# Patient Record
Sex: Female | Born: 1960 | Race: White | Hispanic: No | Marital: Married | Smoking: Never smoker
Health system: Southern US, Community
[De-identification: ages and names within clinical notes are randomized; demographics above are authoritative.]

## PROBLEM LIST (undated history)

## (undated) DIAGNOSIS — E785 Hyperlipidemia, unspecified: Secondary | ICD-10-CM

## (undated) DIAGNOSIS — R7303 Prediabetes: Secondary | ICD-10-CM

## (undated) DIAGNOSIS — Z8603 Personal history of neoplasm of uncertain behavior: Secondary | ICD-10-CM

## (undated) DIAGNOSIS — Z9889 Other specified postprocedural states: Secondary | ICD-10-CM

## (undated) HISTORY — DX: Prediabetes: R73.03

## (undated) HISTORY — DX: Hyperlipidemia, unspecified: E78.5

## (undated) HISTORY — DX: Personal history of neoplasm of uncertain behavior: Z86.03

## (undated) HISTORY — DX: Other specified postprocedural states: Z98.890

---

## 2019-04-16 DIAGNOSIS — G9600 Cerebrospinal fluid leak, unspecified: Secondary | ICD-10-CM | POA: Insufficient documentation

## 2019-06-16 DIAGNOSIS — F331 Major depressive disorder, recurrent, moderate: Secondary | ICD-10-CM | POA: Insufficient documentation

## 2020-05-27 ENCOUNTER — Other Ambulatory Visit: Payer: Self-pay | Admitting: Internal Medicine

## 2020-05-27 DIAGNOSIS — Z1231 Encounter for screening mammogram for malignant neoplasm of breast: Secondary | ICD-10-CM
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
## 2020-11-07 ENCOUNTER — Other Ambulatory Visit: Payer: Self-pay

## 2020-11-07 ENCOUNTER — Telehealth (INDEPENDENT_AMBULATORY_CARE_PROVIDER_SITE_OTHER): Payer: 59 | Admitting: Psychiatry

## 2020-11-07 ENCOUNTER — Encounter: Payer: Self-pay | Admitting: Psychiatry

## 2020-11-07 DIAGNOSIS — F331 Major depressive disorder, recurrent, moderate: Secondary | ICD-10-CM | POA: Diagnosis not present

## 2020-11-07 MED ORDER — BUPROPION HCL ER (XL) 150 MG PO TB24: 450.0000 mg | ORAL_TABLET | Freq: Every day | ORAL | 1 refills | Status: DC

## 2020-11-07 MED ORDER — TRAZODONE HCL 100 MG PO TABS: 100.0000 mg | ORAL_TABLET | Freq: Every day | ORAL | 1 refills | Status: DC

## 2020-11-07 NOTE — Patient Instructions (Addendum)
1. Increase bupropion 450 mg daily 2. Continue lexapro 20 mg daily,  3. Increase Trazodone 100 mg at night  4  Hold lorazepam  5. Referral to therapy  6. Next appointment: 5/9 at 3 PM   CONTACT INFORMATION  What to do if you need to get in touch with someone regarding a psychiatric issue:  1. EMERGENCY: For psychiatric emergencies (if you are suicidal or if there are any other safety issues) call 911 and/or go to your nearest Emergency Room immediately.   2. IF YOU NEED SOMEONE TO TALK TO RIGHT NOW: Given my clinical responsibilities, I may not be able to speak with you over the phone for a prolonged period of time.  A. You may always call The National Suicide Prevention Lifeline at 1-800-273-TALK 416-566-9163).  B. You may walk in to [facility]  Address: [address] [place], [place] [address], Phone: [phone].  Open 24/7, No appointment required.

## 2020-11-18 ENCOUNTER — Ambulatory Visit (INDEPENDENT_AMBULATORY_CARE_PROVIDER_SITE_OTHER): Payer: 59 | Admitting: Licensed Clinical Social Worker

## 2020-11-18 ENCOUNTER — Other Ambulatory Visit: Payer: Self-pay

## 2020-11-18 DIAGNOSIS — F331 Major depressive disorder, recurrent, moderate: Secondary | ICD-10-CM

## 2020-11-18 NOTE — Progress Notes (Signed)
Virtual Visit via Video Note  I connected with Jill Pittman on 11/18/20 at  9:00 AM EDT by a video enabled telemedicine application and verified that I am speaking with the correct person using two identifiers.  Location: Patient: home  Provider: remote office [place], [place])   I discussed the limitations of evaluation and management by telemedicine and the availability of in person appointments. The patient expressed understanding and agreed to proceed.   I discussed the assessment and treatment plan with the patient. The patient was provided an opportunity to ask questions and all were answered. The patient agreed with the plan and demonstrated an understanding of the instructions.   The patient was advised to call back or seek an in-person evaluation if the symptoms worsen or if the condition fails to improve as anticipated.  I provided 60 minutes of non-face-to-face time during this encounter.   Jill Blake R Marcianne Ozbun, LCSW    THERAPIST PROGRESS NOTE  Session Time: 9-10a  Participation Level: Active  Behavioral Response: Neat and Well GroomedAlertDepressed  Type of Therapy: Individual Therapy  Treatment Goals addressed: Coping  Interventions: CBT  Summary: Jill Pittman is a 60 y.o. female who presents with symptoms consistent with MDD. Pt reports that overall mood has been stable with minor reactions to situational stressors.   Allowed pt to explore past history of loss--pt lost three family members in 6 months time: mother, father, sister. Pt feels that she did not get opportunity to appropriately grieve each loss. Explored pts thoughts and feelings during this time and reviewed interventions to help manage anxiety/racing thoughts (meditation/mindfulness). Encouraged mindfulness to help bring down overall high levels of anxiety. Discussed excoriation behaviors and interventions to assist managing this (lotion/nail hygiene).   Due to extensive trauma-focus at initial  visit, CCA will be completed at next session.  Continued recommendations are as follows: self care behaviors, positive social engagements, focusing on overall work/home/life balance, and focusing on positive physical and emotional wellness.   Suicidal/Homicidal: No  Therapist Response: Clinical assessment and development of treatment plan  Plan: Return again in 3 weeks. Finish CCA  Diagnosis: Axis I: MDD, recurrent, moderate    Axis II: No diagnosis    Jill Haber Nitasha Jewel, LCSW 11/18/2020
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
## 2020-12-12 ENCOUNTER — Other Ambulatory Visit: Payer: Self-pay

## 2020-12-12 ENCOUNTER — Telehealth (INDEPENDENT_AMBULATORY_CARE_PROVIDER_SITE_OTHER): Payer: 59 | Admitting: Psychiatry

## 2020-12-12 ENCOUNTER — Encounter: Payer: Self-pay | Admitting: Psychiatry

## 2020-12-12 DIAGNOSIS — F331 Major depressive disorder, recurrent, moderate: Secondary | ICD-10-CM

## 2020-12-12 MED ORDER — VENLAFAXINE HCL ER 37.5 MG PO CP24: 37.5000 mg | ORAL_CAPSULE | Freq: Every day | ORAL | 0 refills | Status: DC

## 2020-12-12 MED ORDER — TRAZODONE HCL 100 MG PO TABS: 100.0000 mg | ORAL_TABLET | Freq: Every day | ORAL | 1 refills | Status: DC

## 2020-12-12 MED ORDER — VENLAFAXINE HCL ER 150 MG PO CP24: ORAL_CAPSULE | ORAL | 0 refills | Status: DC

## 2020-12-12 NOTE — Patient Instructions (Signed)
1. Decrease bupropion 300 mg daily  2. Decrease lexapro 10 mg daily for one week, then discontinue 3. Start venlafaxine 37.5 mg daily for one week, then 75 mg daily  4. Continue Trazodone 100 mg at night  5. Discontinue lorazepam 5. Next appointment: 5/9 at 3 PM

## 2020-12-26 ENCOUNTER — Other Ambulatory Visit: Payer: Self-pay

## 2020-12-26 ENCOUNTER — Ambulatory Visit (INDEPENDENT_AMBULATORY_CARE_PROVIDER_SITE_OTHER): Payer: 59 | Admitting: Licensed Clinical Social Worker

## 2020-12-26 DIAGNOSIS — F331 Major depressive disorder, recurrent, moderate: Secondary | ICD-10-CM | POA: Diagnosis not present

## 2020-12-26 NOTE — Progress Notes (Signed)
Virtual Visit via Video Note  I connected with Jill Pittman on 12/26/20 at  4:00 PM EDT by a video enabled telemedicine application and verified that I am speaking with the correct person using two identifiers.  Location: Patient: home Provider: remote office [place], [place])   I discussed the limitations of evaluation and management by telemedicine and the availability of in person appointments. The patient expressed understanding and agreed to proceed.  I discussed the assessment and treatment plan with the patient. The patient was provided an opportunity to ask questions and all were answered. The patient agreed with the plan and demonstrated an understanding of the instructions.   The patient was advised to call back or seek an in-person evaluation if the symptoms worsen or if the condition fails to improve as anticipated.  I provided 40 minutes of non-face-to-face time during this encounter.   Nikki Rusnak R Dezirae Service, LCSW    THERAPIST PROGRESS NOTE  Session Time: 4-4:40p  Participation Level: Active  Behavioral Response: Neat and Well GroomedAlertAnxious and Depressed  Type of Therapy: Individual Therapy  Treatment Goals addressed: Anxiety and Coping  Interventions: CBT  Summary: Jill Pittman is a 60 y.o. female who presents with symptoms consistent with depression. Pt reports that currently overall mood is stable. Pt reports that she is able to manage stress and anxiety well. Pt reports that she is compliant with medication--reiterated importance of overall medication compliance.   Allowed pt safe space to explore and express thoughts and feelings about recent life events and external stressors. Pt reports that she has had a few challenges recently:  Both cars have needed significant and expensive repairs, pt is feeling hopeless over the "state of the world", and feeling that the world is divided. Encouraged pt to set limits to watching news/current events.   Pt states  that they are currently looking for a home and are fearful that the housing crisis will do the same in [place] as it has in [place].  "that's why we moved here".   Discussed overall bedtime routine and pt states that she is taking Trazodone nightly.  "its hard for me to fall asleep but once im asleep im ok".  Reviewed some strategies that are helpful for sleep hygiene.    Pt and husband planned trip to [place] and pt is excited about it. Encouraged pt to continue w/ recreational activities and overall social engagement as much as possible.    Continued recommendations are as follows: self care behaviors, positive social engagements, focusing on overall work/home/life balance, and focusing on positive physical and emotional wellness.   Suicidal/Homicidal: No  Therapist Response: [name] is verbalizing an understanding of how thoughts, physical feelings, and behavioral actions contribute to anxiety and its treatment. [name] is continuing to utilize behavioral strategies to overcome depression. These behaviors are reflective of both personal growth and progress. Treatment to continue as indicated.  Plan: Return again in 4 weeks.  Diagnosis: Axis I: MDD, recurrent, moderate    Axis II: No diagnosis    Ernest Haber Jonny Longino, LCSW 12/26/2020
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
## 2021-01-16 ENCOUNTER — Encounter: Payer: Self-pay | Admitting: Psychiatry

## 2021-01-16 ENCOUNTER — Other Ambulatory Visit: Payer: Self-pay

## 2021-01-16 ENCOUNTER — Telehealth (INDEPENDENT_AMBULATORY_CARE_PROVIDER_SITE_OTHER): Payer: 59 | Admitting: Psychiatry

## 2021-01-16 DIAGNOSIS — G4700 Insomnia, unspecified: Secondary | ICD-10-CM | POA: Diagnosis not present

## 2021-01-16 DIAGNOSIS — F330 Major depressive disorder, recurrent, mild: Secondary | ICD-10-CM | POA: Diagnosis not present

## 2021-01-16 NOTE — Patient Instructions (Signed)
Continue  bupropion 300 mg daily  2. Continue venlafaxine 150 mg daily   3. Continue Trazodone 100 mg at night  4. Next appointment: 7/19 at 4 PM

## 2021-01-23 ENCOUNTER — Telehealth: Payer: Self-pay

## 2021-01-23 ENCOUNTER — Other Ambulatory Visit: Payer: Self-pay

## 2021-01-23 ENCOUNTER — Ambulatory Visit (INDEPENDENT_AMBULATORY_CARE_PROVIDER_SITE_OTHER): Payer: 59 | Admitting: Licensed Clinical Social Worker

## 2021-01-23 ENCOUNTER — Other Ambulatory Visit: Payer: Self-pay | Admitting: Psychiatry

## 2021-01-23 DIAGNOSIS — Z5329 Procedure and treatment not carried out because of patient's decision for other reasons: Secondary | ICD-10-CM

## 2021-01-23 MED ORDER — VENLAFAXINE HCL ER 150 MG PO CP24: 150.0000 mg | ORAL_CAPSULE | Freq: Every day | ORAL | 0 refills | Status: DC

## 2021-01-23 NOTE — Telephone Encounter (Signed)
received a fax requesting a refill on the venlafaxine er 150mg 

## 2021-01-23 NOTE — Progress Notes (Signed)
LCSW counselor tried to connect with patient for scheduled appointment via MyChart video text request x 2 and email request; also tried to connect via phone without success. LCSW counselor left message for patient to call office number to reschedule OPT appointment.  Sasuke Yaffe, MSW, LCSW Outpatient Therapist/Triage Specialist  

## 2021-01-23 NOTE — Telephone Encounter (Signed)
Ordered
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
## 2021-02-21 ENCOUNTER — Encounter: Payer: Self-pay | Admitting: Psychiatry

## 2021-02-21 ENCOUNTER — Other Ambulatory Visit: Payer: Self-pay

## 2021-02-21 ENCOUNTER — Telehealth (INDEPENDENT_AMBULATORY_CARE_PROVIDER_SITE_OTHER): Payer: 59 | Admitting: Psychiatry

## 2021-02-21 DIAGNOSIS — G4700 Insomnia, unspecified: Secondary | ICD-10-CM | POA: Diagnosis not present

## 2021-02-21 DIAGNOSIS — F330 Major depressive disorder, recurrent, mild: Secondary | ICD-10-CM

## 2021-02-21 MED ORDER — BUPROPION HCL ER (XL) 150 MG PO TB24: 450.0000 mg | ORAL_TABLET | Freq: Every day | ORAL | 0 refills | Status: DC

## 2021-02-21 MED ORDER — VENLAFAXINE HCL ER 37.5 MG PO CP24: ORAL_CAPSULE | ORAL | 0 refills | Status: DC

## 2021-02-21 MED ORDER — HYDROXYZINE HCL 25 MG PO TABS: 25.0000 mg | ORAL_TABLET | Freq: Every day | ORAL | 0 refills | Status: DC | PRN

## 2021-02-21 MED ORDER — TRAZODONE HCL 100 MG PO TABS: 100.0000 mg | ORAL_TABLET | Freq: Every day | ORAL | 0 refills | Status: DC

## 2021-02-21 MED ORDER — VENLAFAXINE HCL ER 150 MG PO CP24: ORAL_CAPSULE | ORAL | 0 refills | Status: DC

## 2021-02-21 NOTE — Patient Instructions (Addendum)
Continue  bupropion 300 mg daily  2. Increase  venlafaxine 187.5 mg daily   3. Continue Trazodone 100 mg at night  4. Start hydroxyzine 25 mg daily as needed for anxiety 4. Next appointment: 10/10 at 1:40

## 2021-03-10 ENCOUNTER — Other Ambulatory Visit: Payer: Self-pay

## 2021-03-10 ENCOUNTER — Ambulatory Visit (INDEPENDENT_AMBULATORY_CARE_PROVIDER_SITE_OTHER): Payer: 59 | Admitting: Licensed Clinical Social Worker

## 2021-03-10 ENCOUNTER — Encounter: Payer: Self-pay | Admitting: Licensed Clinical Social Worker

## 2021-03-10 DIAGNOSIS — F330 Major depressive disorder, recurrent, mild: Secondary | ICD-10-CM | POA: Diagnosis not present

## 2021-03-10 NOTE — Progress Notes (Signed)
Virtual Visit via Video Note  I connected with [name] on 03/10/21 at 10:00 AM EDT by a video enabled telemedicine application and verified that I am speaking with the correct person using two identifiers.  Location: Patient: home Provider: remote office [place], [place])   I discussed the limitations of evaluation and management by telemedicine and the availability of in person appointments. The patient expressed understanding and agreed to proceed.   I discussed the assessment and treatment plan with the patient. The patient was provided an opportunity to ask questions and all were answered. The patient agreed with the plan and demonstrated an understanding of the instructions.   The patient was advised to call back or seek an in-person evaluation if the symptoms worsen or if the condition fails to improve as anticipated.  I provided 40 minutes of non-face-to-face time during this encounter.   Jill Pittman R Jill Blackard, LCSW   THERAPIST PROGRESS NOTE  Session Time: 10-10:40a  Participation Level: Active  Behavioral Response: Neat and Well GroomedAlertAnxious  Type of Therapy: Individual Therapy  Treatment Goals addressed: Anxiety and Coping  Interventions: Solution Focused and Supportive  Summary: Jill Pittman is a 60 y.o. female who presents with improving symptoms related to depression diagnosis. patient reports that overall mood is stable. Patient reports that she is managing stress and anxiety symptoms well. Allowed patient safe space to explore and express thoughts and feelings associated with recent external stressors. Patient reports that she had a colleague that she had a personality conflict with a couple of weeks ago. Patient reports that she did struggle with this for quite some time, and then had a direct conversation with her supervisor. Patient is no longer working with this individual, and is feeling less stress about work currently. Discussed benefits of  assertive communication skills. Patient reports that her and husband recently placed an offer on a house that was rejected. Allowed patient safe space to explore thoughts and feelings associated with this, and plans moving forward. Discussed husbands current dislike of job, and how a history of DUI several years ago has been a barrier to future employment. Patient reports that she feels her husband is depressed, and is trying to be a supportive spouse. Patient says that she's trying to get him counseling, and other psychiatric services. Reviewed current coping mechanisms, and explored new coping mechanisms that can help patient manage anxiety and depression symptoms. Patient reports that she likes to have trips planned, and is planning to go visit son in [place] soon.Continued recommendations are as follows: self care behaviors, positive social engagements, focusing on overall work/home/life balance, and focusing on positive physical and emotional wellness.  .   Suicidal/Homicidal: No  Therapist Response: Jill Pittman is verbalizing an understanding of how thoughts, physical feelings, and behavioral actions contribute to anxiety and its treatment. Jill Pittman is continuing to utilize behavioral strategies to overcome depression. These behaviors are reflective of both personal growth and progress. Treatment to continue as indicated.  Plan: Return again in 4 weeks.  Diagnosis: Axis I: MDD, recurrent    Axis II: No diagnosis    Jill Pittman Jill Renninger, LCSW 03/10/2021

## 2021-04-17 ENCOUNTER — Ambulatory Visit (INDEPENDENT_AMBULATORY_CARE_PROVIDER_SITE_OTHER): Payer: 59 | Admitting: Licensed Clinical Social Worker

## 2021-04-17 ENCOUNTER — Other Ambulatory Visit: Payer: Self-pay

## 2021-04-17 DIAGNOSIS — F330 Major depressive disorder, recurrent, mild: Secondary | ICD-10-CM | POA: Diagnosis not present

## 2021-04-18 NOTE — Progress Notes (Signed)
Virtual Visit via Video Note  I connected with Scarlette Slice on 04/18/21 at  4:00 PM EDT by a video enabled telemedicine application and verified that I am speaking with the correct person using two identifiers.  Location: Patient: home Provider: remote office [place], [place])   I discussed the limitations of evaluation and management by telemedicine and the availability of in person appointments. The patient expressed understanding and agreed to proceed.  I discussed the assessment and treatment plan with the patient. The patient was provided an opportunity to ask questions and all were answered. The patient agreed with the plan and demonstrated an understanding of the instructions.   The patient was advised to call back or seek an in-person evaluation if the symptoms worsen or if the condition fails to improve as anticipated.  I provided of non-face-to-face time during this encounter.   Diyari Cherne R Kiyara Bouffard, LCSW   THERAPIST PROGRESS NOTE  Session Time: 4-4:45p  Participation Level: Active  Behavioral Response: Neat and Well GroomedAlertAnxious and Depressed  Type of Therapy: Individual Therapy  Treatment Goals addressed: Anxiety and Diagnosis: depression  Interventions: CBT and Supportive  Summary: Khylie Larmore is a 60 y.o. female who presents with improving symptoms related to depression diagnosis. Pt reports that overall mood has been stable recently and that she feels she is managing stress and anxiety symptoms. Pt reports that she feels medication is managing symptoms well.  Pt reporting good quality and quantity of sleep.   Allowed pt to explore thoughts and feelings associated with recent external stressors and life events. Discussed pts husband's continuing job search. Pt feels her husband's DUI from years ago is hindering job search since he is seeking driving positions. Discussed pt and husband's goal of getting record expunged.   Suicidal/Homicidal:  No  Therapist Response: [name] is verbalizing an understanding of how thoughts, physical feelings, and behavioral actions contribute to anxiety and its treatment. [name] is continuing to utilize behavioral strategies to overcome depression. These behaviors are reflective of both personal growth and progress. Treatment to continue as indicated.  Plan: Return again in 4 weeks.  Diagnosis: Axis I: MDD, recurrent, mild    Axis II: No diagnosis    Ernest Haber Brennon Otterness, LCSW 04/18/2021

## 2021-05-11 ENCOUNTER — Telehealth: Payer: Self-pay

## 2021-05-11 NOTE — Telephone Encounter (Signed)
Will hold at this time, pending next appointment (she should have enough for the next few weeks)

## 2021-05-11 NOTE — Telephone Encounter (Signed)
pt left message that she needs refills on all her medications wellbutrin, hydroxyzine, trazodone, effexxor both
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
## 2021-05-18 ENCOUNTER — Other Ambulatory Visit: Payer: Self-pay

## 2021-05-18 ENCOUNTER — Telehealth (INDEPENDENT_AMBULATORY_CARE_PROVIDER_SITE_OTHER): Payer: 59 | Admitting: Psychiatry

## 2021-05-18 ENCOUNTER — Encounter: Payer: Self-pay | Admitting: Psychiatry

## 2021-05-18 DIAGNOSIS — F330 Major depressive disorder, recurrent, mild: Secondary | ICD-10-CM

## 2021-05-18 DIAGNOSIS — G4700 Insomnia, unspecified: Secondary | ICD-10-CM | POA: Diagnosis not present

## 2021-05-18 DIAGNOSIS — F411 Generalized anxiety disorder: Secondary | ICD-10-CM

## 2021-05-18 MED ORDER — VENLAFAXINE HCL ER 75 MG PO CP24: 225.0000 mg | ORAL_CAPSULE | Freq: Every day | ORAL | 0 refills | Status: DC

## 2021-05-18 MED ORDER — TRAZODONE HCL 100 MG PO TABS: 100.0000 mg | ORAL_TABLET | Freq: Every day | ORAL | 0 refills | Status: DC

## 2021-05-18 MED ORDER — HYDROXYZINE HCL 25 MG PO TABS: 25.0000 mg | ORAL_TABLET | Freq: Every day | ORAL | 0 refills | Status: AC | PRN

## 2021-05-18 MED ORDER — BUPROPION HCL ER (XL) 300 MG PO TB24: 300.0000 mg | ORAL_TABLET | Freq: Every day | ORAL | 0 refills | Status: DC

## 2021-05-18 NOTE — Patient Instructions (Signed)
Continue  bupropion 300 mg daily  Increase venlafaxine to 225 mg daily Continue trazodone 100 mg at night as needed for sleep Continue hydroxyzine 25 mg daily as needed for anxiety Next appointment 1/10 at 1:20

## 2021-05-29 ENCOUNTER — Other Ambulatory Visit: Payer: Self-pay

## 2021-05-29 ENCOUNTER — Ambulatory Visit (INDEPENDENT_AMBULATORY_CARE_PROVIDER_SITE_OTHER): Payer: 59 | Admitting: Licensed Clinical Social Worker

## 2021-05-29 DIAGNOSIS — F411 Generalized anxiety disorder: Secondary | ICD-10-CM | POA: Diagnosis not present

## 2021-05-29 DIAGNOSIS — F330 Major depressive disorder, recurrent, mild: Secondary | ICD-10-CM | POA: Diagnosis not present

## 2021-05-29 NOTE — Progress Notes (Signed)
Virtual Visit via Video Note  I connected with Scarlette Slice on 05/29/21 at  4:00 PM EDT by a video enabled telemedicine application and verified that I am speaking with the correct person using two identifiers.  Location: Patient: home Provider: remote office [place], [place])   I discussed the limitations of evaluation and management by telemedicine and the availability of in person appointments. The patient expressed understanding and agreed to proceed.  I discussed the assessment and treatment plan with the patient. The patient was provided an opportunity to ask questions and all were answered. The patient agreed with the plan and demonstrated an understanding of the instructions.   The patient was advised to call back or seek an in-person evaluation if the symptoms worsen or if the condition fails to improve as anticipated.  I provided 30 minutes of non-face-to-face time during this encounter.   Breezie Micucci R Jaki Hammerschmidt, LCSW   THERAPIST PROGRESS NOTE  Session Time: 4-430p  Participation Level: Active  Behavioral Response: NeatAlertAnxious  Type of Therapy: Individual Therapy  Treatment Goals addressed:  Goal: STG: Grete "Ronni" WILL PARTICIPATE IN AT LEAST 80% OF SCHEDULED INDIVIDUAL PSYCHOTHERAPY SESSIONS Outcome: Progressing Interventions:  Intervention: REVIEW PLEASE SKILLS (TREAT PHYSICAL ILLNESS, BALANCE EATING, AVOID MOOD-ALTERING SUBSTANCES, BALANCE SLEEP AND GET EXERCISE) WITH Deseray "Ronni"  Intervention: Assess emotional status and coping mechanisms  Intervention: Educate patient on: Stress management Summary: Starkeisha Vanwinkle is a 60 y.o.    who presents with improving symptoms related to depression and anxiety. Pt reports mood is fairly stable at time of session. Pt reports that she is getting good quality and quantity of sleep.  Allowed pt to explore and express thoughts and feelings associated with recent life situations and external stressors. Pt is excited  about new home purchase--her first home purchase. Pt states that she is so excited and wants to tell her late mother and is sad that she is not here to tell. Reviewed healthy and unhealthy coping strategies.   Continued recommendations are as follows: self care behaviors, positive social engagements, focusing on overall work/home/life balance, and focusing on positive physical and emotional wellness.    Suicidal/Homicidal: No  Therapist Response: Pt is continuing to apply interventions learned in session into daily life situations. Pt is currently on track to meet goals utilizing interventions mentioned above. Personal growth and progress noted. Treatment to continue as indicated.   Plan: Return again in 4 weeks.  Diagnosis: Axis I: MDD, recurrent; anxiety    Axis II: No diagnosis    Ernest Haber Farha Dano, LCSW 05/29/2021

## 2021-05-29 NOTE — Plan of Care (Signed)
  Problem: Decrease depressive symptoms and improve levels of effective functioning Goal: STG: Edona "Ronni" WILL PARTICIPATE IN AT LEAST 80% OF SCHEDULED INDIVIDUAL PSYCHOTHERAPY SESSIONS Outcome: Progressing Intervention: REVIEW PLEASE SKILLS (TREAT PHYSICAL ILLNESS, BALANCE EATING, AVOID MOOD-ALTERING SUBSTANCES, BALANCE SLEEP AND GET EXERCISE) WITH Rodina "Ronni" Intervention: Assess emotional status and coping mechanisms Intervention: Educate patient on: Stress management

## 2021-07-12 ENCOUNTER — Other Ambulatory Visit: Payer: Self-pay | Admitting: Psychiatry

## 2021-07-17 ENCOUNTER — Other Ambulatory Visit: Payer: Self-pay

## 2021-07-17 ENCOUNTER — Ambulatory Visit (INDEPENDENT_AMBULATORY_CARE_PROVIDER_SITE_OTHER): Payer: Self-pay | Admitting: Licensed Clinical Social Worker

## 2021-07-17 DIAGNOSIS — Z91199 Patient's noncompliance with other medical treatment and regimen due to unspecified reason: Secondary | ICD-10-CM

## 2021-07-17 NOTE — Progress Notes (Signed)
LCSW counselor tried to connect with patient for scheduled appointment via MyChart video text request x 2 and email request; also tried to connect via phone without success. LCSW counselor left message for patient to call office number to reschedule OPT appointment.  

## 2021-07-24 ENCOUNTER — Other Ambulatory Visit: Payer: Self-pay | Admitting: Psychiatry
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
## 2021-08-13 ENCOUNTER — Other Ambulatory Visit: Payer: Self-pay | Admitting: Psychiatry

## 2021-08-14 ENCOUNTER — Ambulatory Visit: Payer: 59 | Admitting: Family

## 2021-08-15 ENCOUNTER — Telehealth: Payer: 59 | Admitting: Psychiatry
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
## 2021-08-28 ENCOUNTER — Encounter: Payer: Self-pay | Admitting: Psychiatry

## 2021-08-28 ENCOUNTER — Other Ambulatory Visit: Payer: Self-pay

## 2021-08-28 ENCOUNTER — Telehealth (INDEPENDENT_AMBULATORY_CARE_PROVIDER_SITE_OTHER): Payer: 59 | Admitting: Psychiatry

## 2021-08-28 DIAGNOSIS — F330 Major depressive disorder, recurrent, mild: Secondary | ICD-10-CM

## 2021-08-28 DIAGNOSIS — G4700 Insomnia, unspecified: Secondary | ICD-10-CM | POA: Diagnosis not present

## 2021-08-28 DIAGNOSIS — F411 Generalized anxiety disorder: Secondary | ICD-10-CM

## 2021-08-28 MED ORDER — VENLAFAXINE HCL ER 75 MG PO CP24: 225.0000 mg | ORAL_CAPSULE | Freq: Every day | ORAL | 1 refills | Status: DC

## 2021-08-28 MED ORDER — BUPROPION HCL ER (XL) 300 MG PO TB24: 300.0000 mg | ORAL_TABLET | Freq: Every day | ORAL | 0 refills | Status: DC

## 2021-08-28 MED ORDER — TRAZODONE HCL 100 MG PO TABS: 100.0000 mg | ORAL_TABLET | Freq: Every day | ORAL | 0 refills | Status: DC

## 2021-08-28 NOTE — Patient Instructions (Signed)
Continue  bupropion 300 mg daily  Continue venlafaxine to 225 mg daily Continue trazodone 100 mg at night as needed for sleep Continue hydroxyzine 25 mg daily as needed for anxiety Next appointment: 4/3 at 1 PM

## 2021-09-11 ENCOUNTER — Ambulatory Visit (INDEPENDENT_AMBULATORY_CARE_PROVIDER_SITE_OTHER): Payer: 59 | Admitting: Licensed Clinical Social Worker

## 2021-09-11 ENCOUNTER — Other Ambulatory Visit: Payer: Self-pay

## 2021-09-11 DIAGNOSIS — F330 Major depressive disorder, recurrent, mild: Secondary | ICD-10-CM

## 2021-09-11 DIAGNOSIS — F411 Generalized anxiety disorder: Secondary | ICD-10-CM

## 2021-09-11 NOTE — Plan of Care (Signed)
°  Problem: Decrease depressive symptoms and improve levels of effective functioning Goal: STG: Jill "Ronni" WILL PARTICIPATE IN AT LEAST 80% OF SCHEDULED INDIVIDUAL PSYCHOTHERAPY SESSIONS Outcome: Progressing Goal: LTG: Reduce frequency, intensity, and duration of depression symptoms as evidenced by: SSB input needed on appropriate metric Outcome: Progressing
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
## 2021-09-11 NOTE — Plan of Care (Signed)
°  Problem: Decrease depressive symptoms and improve levels of effective functioning Goal: STG: Jill "Ronni" WILL PARTICIPATE IN AT LEAST 80% OF SCHEDULED INDIVIDUAL PSYCHOTHERAPY SESSIONS Outcome: Progressing Goal: LTG: Reduce frequency, intensity, and duration of depression symptoms as evidenced by: SSB input needed on appropriate metric Outcome: Progressing  ntervention: REVIEW PLEASE SKILLS (TREAT PHYSICAL ILLNESS, BALANCE EATING, AVOID MOOD-ALTERING SUBSTANCES, BALANCE SLEEP AND GET EXERCISE) WITH Jill "Ronni"  Intervention: Assess emotional status and coping mechanisms  Intervention: Educate patient on: Stress management

## 2021-10-13 ENCOUNTER — Other Ambulatory Visit: Payer: Self-pay | Admitting: Psychiatry

## 2021-10-30 ENCOUNTER — Other Ambulatory Visit: Payer: Self-pay

## 2021-10-30 ENCOUNTER — Ambulatory Visit (INDEPENDENT_AMBULATORY_CARE_PROVIDER_SITE_OTHER): Payer: 59 | Admitting: Licensed Clinical Social Worker

## 2021-10-30 DIAGNOSIS — F330 Major depressive disorder, recurrent, mild: Secondary | ICD-10-CM

## 2021-10-30 NOTE — Plan of Care (Signed)
?  Problem: Decrease depressive symptoms and improve levels of effective functioning ?Goal: STG: Viviane "Ronni" WILL PARTICIPATE IN AT LEAST 80% OF SCHEDULED INDIVIDUAL PSYCHOTHERAPY SESSIONS ?Outcome: Progressing ?Goal: LTG: Reduce frequency, intensity, and duration of depression symptoms as evidenced by: SSB input needed on appropriate metric ?Outcome: Progressing ?  ?

## 2021-10-30 NOTE — Progress Notes (Signed)
Virtual Visit via Video Note ? ?I connected with Jill Pittman on 10/30/21 at  4:00 PM EDT by a video enabled telemedicine application and verified that I am speaking with the correct person using two identifiers. ? ?Location: ?Patient: home ?Provider: remote office [place], [place]) ?  ?I discussed the limitations of evaluation and management by telemedicine and the availability of in person appointments. The patient expressed understanding and agreed to proceed. ?  ?I discussed the assessment and treatment plan with the patient. The patient was provided an opportunity to ask questions and all were answered. The patient agreed with the plan and demonstrated an understanding of the instructions. ?  ?The patient was advised to call back or seek an in-person evaluation if the symptoms worsen or if the condition fails to improve as anticipated. ? ?I provided 45 minutes of non-face-to-face time during this encounter. ? ? ?Jill Pittman Jill Yamada, LCSW ? ? ?THERAPIST PROGRESS NOTE ? ?Session Time: 415-5p ? ?Participation Level: Active ? ?Behavioral Response: Neat and Well GroomedAlertAnxious and Depressed ? ?Type of Therapy: Individual Therapy ? ?Treatment Goals addressed:  ? ?Problem: Decrease depressive symptoms and improve levels of effective functioning ?Goal: STG: Jill "Ronni" WILL PARTICIPATE IN AT LEAST 80% OF SCHEDULED INDIVIDUAL PSYCHOTHERAPY SESSIONS ?Outcome: Progressing ? ?Goal: LTG: Reduce frequency, intensity, and duration of depression symptoms as evidenced by: SSB input needed on appropriate metric ?Outcome: Progressing ?  ? ?ProgressTowards Goals: Progressing ? ?Interventions: CBT ? ?Summary: Jill Pittman is a 61 y.o. female who presents with improving symptoms related to depression diagnosis. Pt reports that overall mood has been stable and that she is getting better quality and quantity of sleep. Pt compliant with medication and no significant side effects.  ? ?Allowed pt to explore and express  thoughts and feelings associated with recent life situations and external stressors. Discussed relationship with husband, financial concerns, and several different management strategies. Discussed concerns about focus and concentration--explored behaviors that help manage lack of focus and concentration, especially when working from home.  ? ?Continued recommendations are as follows: self care behaviors, positive social engagements, focusing on overall work/home/life balance, and focusing on positive physical and emotional wellness.  ? ?Suicidal/Homicidal: No--pt admits that she has had some thoughts with no plans or intent to follow through. Thoughts are fleeting  ? ?Therapist Response: Pt is continuing to apply interventions learned in session into daily life situations. Pt is currently on track to meet goals utilizing interventions mentioned above. Personal growth and progress noted. Treatment to continue as indicated.  ? ? ?Plan: Return again in 4 weeks. ? ?Diagnosis: Mild episode of recurrent major depressive disorder (HCC) ? ?Collaboration of Care:pt encouraged to continue care with psychiatrist of record, Dr. Vanetta Pittman. ? ?Patient/Guardian was advised Release of Information must be obtained prior to any record release in order to collaborate their care with an outside provider. Patient/Guardian was advised if they have not already done so to contact the registration department to sign all necessary forms in order for [place] to release information regarding their care.  ? ?Consent: Patient/Guardian gives verbal consent for treatment and assignment of benefits for services provided during this visit. Patient/Guardian expressed understanding and agreed to proceed.  ? ?Jill Pittman Jill Dugo, LCSW ?10/30/2021 ? ?

## 2021-11-02 NOTE — Progress Notes (Addendum)
Virtual Visit via Video Note ? ?I connected with Jill Pittman on 11/06/21 at  1:00 PM EDT by a video enabled telemedicine application and verified that I am speaking with the correct person using two identifiers. ? ?Location: ?Patient: home ?Provider: office ?Persons participated in the visit- patient, provider  ?  ?I discussed the limitations of evaluation and management by telemedicine and the availability of in person appointments. The patient expressed understanding and agreed to proceed. ? ?  ?I discussed the assessment and treatment plan with the patient. The patient was provided an opportunity to ask questions and all were answered. The patient agreed with the plan and demonstrated an understanding of the instructions. ?  ?The patient was advised to call back or seek an in-person evaluation if the symptoms worsen or if the condition fails to improve as anticipated. ? ?I provided 15 minutes of non-face-to-face time during this encounter. ? ? ?Neysa Hottereina Samani Deal, MD ? ? ? ?BH MD/PA/NP OP Progress Note ? ?11/06/2021 1:23 PM ?Jill Pittman  ?MRN:  [id number] ? ?Chief Complaint:  ?Chief Complaint  ?Patient presents with  ? Follow-up  ? Depression  ? ?HPI:  ?This is a follow-up appointment for depression and insomnia.  ?She states that her brain is scattered, and it has been affecting her work.  She has millions of thoughts going through her mind.  She feels stressed due to upcoming wedding of her son in June.  Although she feels excited, she has been feeling stressed due to her trying to get things done.  She thinks it has been affecting her relationship with her husband as both of them are now stressed.  She thinks her mood is better otherwise.  She feels less down.  She sleeps well.  She denies change in appetite.  Although she reports occasional fleeting passive SI, she adamantly denies any plan or intent.  She denies panic attacks.  She takes hydroxyzine only occasionally.  She denies alcohol use.  She uses THC  occasionally to get off from her mind.  She is willing to try quetiapine at this time for anxiety.  ? ? ?Wt Readings from Last 3 Encounters:  ?No data found for Wt  ?  ? ? 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 ? ?Visit Diagnosis:  ?  ICD-10-CM   ?1. MDD (major depressive disorder), recurrent, in partial remission (HCC)  F33.41   ?  ?2. GAD (generalized anxiety disorder)  F41.1   ?  ?3. Insomnia, unspecified type  G47.00   ?  ? ? ?Past Psychiatric History: Please see initial evaluation for full details. I have reviewed the history. No updates at this time.  ?  ? ?Past Medical History:  ?Past Medical History:  ?Diagnosis Date  ? History of craniotomy   ? Hx of resection of meningioma   ? Hyperlipidemia   ? Prediabetes   ? No past surgical history on file. ? ?Family Psychiatric History: Please see initial evaluation for full details. I have reviewed the history. No updates at this time.  ? ? ?Family History:  ?Family History  ?Problem Relation Age of Onset  ? Alcohol abuse Mother   ? Alcohol abuse Maternal Grandfather   ? ? ?  Social History:  ?Social History  ? ?Socioeconomic History  ? Marital status: Married  ?  Spouse name: Not on file  ? Number of children: Not on file  ? Years of education: Not on file  ? Highest education level: Not on file  ?Occupational History  ? Not on file  ?Tobacco Use  ? Smoking status: Not on file  ? Smokeless tobacco: Not on file  ?Substance and Sexual Activity  ? Alcohol use: Not on file  ? Drug use: Not on file  ? Sexual activity: Not on file  ?Other Topics Concern  ? Not on file  ?Social History Narrative  ? Not on file  ? ?Social Determinants of  Health  ? ?Financial Resource Strain: Not on file  ?Food Insecurity: Not on file  ?Transportation Needs: Not on file  ?Physical Activity: Not on file  ?Stress: Not on file  ?Social Connections: Not on file  ? ? ?Allergies:  ?Allergies  ?Allergen Reactions  ? Penicillins Rash  ? ? ?Metabolic Disorder Labs: ?No results found for: HGBA1C, MPG ?No results found for: PROLACTIN ?No results found for: CHOL, TRIG, HDL, CHOLHDL, VLDL, LDLCALC ?No results found for: TSH ? ?Therapeutic Level Labs: ?No results found for: LITHIUM ?No results found for: VALPROATE ?No components found for:  CBMZ ? ?Current Medications: ?Current Outpatient Medications  ?Medication Sig Dispense Refill  ? QUEtiapine (SEROQUEL) 25 MG tablet Take 1 tablet (25 mg total) by mouth at bedtime. 30 tablet 1  ? [START ON 11/27/2021] buPROPion (WELLBUTRIN XL) 300 MG 24 hr tablet Take 1 tablet (300 mg total) by mouth daily. 90 tablet 1  ? levothyroxine (SYNTHROID) 150 MCG tablet Take 150 mcg by mouth daily before breakfast.    ? metFORMIN (GLUCOPHAGE) 500 MG tablet Take 3 tablets by mouth 2 (two) times daily with a meal.    ? simvastatin (ZOCOR) 10 MG tablet Take 10 mg by mouth daily.    ? testosterone cypionate (DEPOTESTOTERONE CYPIONATE) 100 MG/ML injection Inject into the muscle every 30 (thirty) days. For IM use only    ? traZODone (DESYREL) 100 MG tablet Take 1 tablet (100 mg total) by mouth at bedtime. 90 tablet 0  ? venlafaxine XR (EFFEXOR-XR) 75 MG 24 hr capsule Take 3 capsules (225 mg total) by mouth daily with breakfast. 270 capsule 1  ? ?No current facility-administered medications for this visit.  ? ? ? ?Musculoskeletal: ?Strength & Muscle Tone:  N/A ?Gait & Station:  N/A ?Patient leans: N/A ? ?Psychiatric Specialty Exam: ?Review of Systems  ?Psychiatric/Behavioral:  Positive for decreased concentration and suicidal ideas. Negative for agitation, behavioral problems, confusion, dysphoric mood, hallucinations, self-injury and sleep disturbance. The  patient is nervous/anxious. The patient is not hyperactive.   ?All other systems reviewed and are negative.  ?There were no vitals taken for this visit.There is no height or weight on file to calculate BMI.  ?General Appearance: Fairly Groomed  ?Eye Contact:  Good  ?Speech:  Clear and Coherent  ?Volume:  Normal  ?Mood:  Anxious  ?Affect:  Appropriate, Congruent, and euthymic  ?Thought Process:  Coherent  ?Orientation:  Full (Time, Place, and Person)  ?Thought Content: Logical   ?Suicidal Thoughts:  No  ?Homicidal Thoughts:  No  ?Memory:  Immediate;   Good  ?Judgement:  Good  ?Insight:  Good  ?Psychomotor Activity:  Normal  ?Concentration:  Concentration: Good and Attention Span: Good  ?Recall:  Good  ?Fund of Knowledge: Good  ?Language: Good  ?Akathisia:  No  ?  Handed:  Right  ?AIMS (if indicated): not done  ?Assets:  Communication Skills ?Desire for Improvement  ?ADL's:  Intact  ?Cognition: WNL  ?Sleep:  Good  ? ?Screenings: ?PHQ2-9   ? ?Flowsheet Row Counselor from 10/30/2021 in [facility] Counselor from 05/29/2021 in [facility] Counselor from 03/10/2021 in [facility] Video Visit from 12/12/2020 in [facility] Video Visit from 11/07/2020 in [facility]  ?PHQ-2 Total Score 2 2 2 2 4   ?PHQ-9 Total Score 7 -- -- -- 15  ? ?  ? ?Flowsheet Row Counselor from 10/30/2021 in [facility] Counselor from 05/29/2021 in [facility] Counselor from 03/10/2021 in [facility]  ?C-SSRS RISK CATEGORY No Risk Error: Q3, 4, or 5 should not be populated when Q2 is No No Risk  ? ?  ? ? ? ?Assessment and Plan:  ?Javonne Louissaint is a 61 y.o. year old female with a history of depression, anxiety, hypothyroidism, sleep apnea, s/p meningeotomy, who presents for follow up appointment for below.  ? ?1. MDD (major depressive  disorder), recurrent, in partial remission (HCC) ?2. GAD (generalized anxiety disorder) ?She reports significant worsening in mood and anxiety symptoms in the context of upcoming wedding of her son in June.  Oth

## 2021-11-06 ENCOUNTER — Encounter: Payer: Self-pay | Admitting: Family

## 2021-11-06 ENCOUNTER — Ambulatory Visit: Payer: 59 | Admitting: Family

## 2021-11-06 ENCOUNTER — Telehealth (INDEPENDENT_AMBULATORY_CARE_PROVIDER_SITE_OTHER): Payer: 59 | Admitting: Psychiatry

## 2021-11-06 ENCOUNTER — Encounter: Payer: Self-pay | Admitting: Psychiatry

## 2021-11-06 VITALS — BP 138/78 | HR 100 | Temp 98.0°F | Ht 65.0 in | Wt 139.6 lb

## 2021-11-06 DIAGNOSIS — Z0000 Encounter for general adult medical examination without abnormal findings: Secondary | ICD-10-CM | POA: Diagnosis not present

## 2021-11-06 DIAGNOSIS — G4700 Insomnia, unspecified: Secondary | ICD-10-CM

## 2021-11-06 DIAGNOSIS — F411 Generalized anxiety disorder: Secondary | ICD-10-CM

## 2021-11-06 DIAGNOSIS — Z9889 Other specified postprocedural states: Secondary | ICD-10-CM | POA: Diagnosis not present

## 2021-11-06 DIAGNOSIS — E782 Mixed hyperlipidemia: Secondary | ICD-10-CM

## 2021-11-06 DIAGNOSIS — Z1231 Encounter for screening mammogram for malignant neoplasm of breast: Secondary | ICD-10-CM

## 2021-11-06 DIAGNOSIS — F3341 Major depressive disorder, recurrent, in partial remission: Secondary | ICD-10-CM

## 2021-11-06 DIAGNOSIS — Z780 Asymptomatic menopausal state: Secondary | ICD-10-CM | POA: Diagnosis not present

## 2021-11-06 DIAGNOSIS — R7303 Prediabetes: Secondary | ICD-10-CM

## 2021-11-06 DIAGNOSIS — E785 Hyperlipidemia, unspecified: Secondary | ICD-10-CM | POA: Insufficient documentation

## 2021-11-06 MED ORDER — QUETIAPINE FUMARATE 25 MG PO TABS: 25.0000 mg | ORAL_TABLET | Freq: Every day | ORAL | 1 refills | Status: DC

## 2021-11-06 MED ORDER — BUPROPION HCL ER (XL) 300 MG PO TB24: 300.0000 mg | ORAL_TABLET | Freq: Every day | ORAL | 1 refills | Status: DC

## 2021-11-06 NOTE — Patient Instructions (Signed)
Continue  bupropion 300 mg daily  ?Continue venlafaxine to 225 mg daily  ?Start quetiapine 25 mg at night  ?Continue trazodone 100 mg at night as needed for sleep ?Continue hydroxyzine 25 mg daily as needed for anxiety ?Next appointment: 5/22 at 1:40 ?

## 2021-11-06 NOTE — Progress Notes (Signed)
?Phone [phone] ?  ?Subjective:  ? ?Patient is a 61 y.o. female presenting for annual physical.   ? ?Chief Complaint  ?Patient presents with  ? New Patient (Initial Visit)  ?  Requesting CPE today   ? ? ?See problem oriented charting- ?ROS- full  review of systems was completed and negative ? ?The following were reviewed and entered/updated in epic: ?Past Medical History:  ?Diagnosis Date  ? History of craniotomy   ? Hx of resection of meningioma   ? Hyperlipidemia   ? Prediabetes   ? ?Patient Active Problem List  ? Diagnosis Date Noted  ? History of craniotomy 11/12/2021  ? Postmenopausal estrogen deficiency 11/06/2021  ? Prediabetes   ? Hyperlipidemia   ? ?History reviewed. No pertinent surgical history. ? ?Family History  ?Problem Relation Age of Onset  ? Alcohol abuse Mother   ? Alcohol abuse Maternal Grandfather   ? ? ?Medications- reviewed and updated ?Current Outpatient Medications  ?Medication Sig Dispense Refill  ? [START ON 11/27/2021] buPROPion (WELLBUTRIN XL) 300 MG 24 hr tablet Take 1 tablet (300 mg total) by mouth daily. 90 tablet 1  ? hydrOXYzine (ATARAX) 25 MG tablet Take 25 mg by mouth 3 (three) times daily as needed.    ? levothyroxine (SYNTHROID) 150 MCG tablet Take 150 mcg by mouth daily before breakfast.    ? metFORMIN (GLUCOPHAGE) 500 MG tablet Take 1 tablet by mouth 2 (two) times daily with a meal.    ? simvastatin (ZOCOR) 10 MG tablet Take 10 mg by mouth daily.    ? testosterone cypionate (DEPOTESTOTERONE CYPIONATE) 100 MG/ML injection Inject into the muscle every 30 (thirty) days. For IM use only    ? traZODone (DESYREL) 100 MG tablet Take 1 tablet (100 mg total) by mouth at bedtime. 90 tablet 0  ? venlafaxine XR (EFFEXOR-XR) 75 MG 24 hr capsule Take 3 capsules (225 mg total) by mouth daily with breakfast. 270 capsule 1  ? QUEtiapine (SEROQUEL) 25 MG tablet Take 1 tablet (25 mg total) by mouth at bedtime. 30 tablet 1  ? ?No current facility-administered medications for this visit.   ? ?Allergies-reviewed and updated ?Allergies  ?Allergen Reactions  ? Penicillins Rash  ? ? ?Social History  ? ?Social History Narrative  ? pt married, moved from [place] during pandemic, rented RV and travelled all over [place] for 1 year  ? ?Objective  ?Objective:  ?BP 138/78   Pulse 100   Temp 98 ?F (36.7 ?C) (Temporal)   Ht 5\' 5"  (1.651 m)   Wt 139 lb 9.6 oz (63.3 kg)   SpO2 98%   BMI 23.23 kg/m?  ?Physical Exam ?Vitals and nursing note reviewed.  ?Constitutional:   ?   Appearance: Normal appearance.  ?HENT:  ?   Head: Normocephalic.  ?   Right Ear: Tympanic membrane normal.  ?   Left Ear: Tympanic membrane normal.  ?   Nose: Nose normal.  ?   Mouth/Throat:  ?   Mouth: Mucous membranes are moist.  ?Eyes:  ?   Pupils: Pupils are equal, round, and reactive to light.  ?Cardiovascular:  ?   Rate and Rhythm: Normal rate and regular rhythm.  ?Pulmonary:  ?   Effort: Pulmonary effort is normal.  ?   Breath sounds: Normal breath sounds.  ?Musculoskeletal:     ?   General: Normal range of motion.  ?   Cervical back: Normal range of motion.  ?Lymphadenopathy:  ?   Cervical: No cervical adenopathy.  ?  Skin: ?   General: Skin is warm and dry.  ?Neurological:  ?   Mental Status: She is alert.  ?Psychiatric:     ?   Mood and Affect: Mood normal.     ?   Behavior: Behavior normal.  ? ?  ?Assessment and Plan  ? ?Health Maintenance counseling: ?1. Anticipatory guidance: Patient counseled regarding regular dental exams q6 months, eye exams,  avoiding smoking and second hand smoke, limiting alcohol to 1 beverage per day, no illicit drugs.   ?2. Risk factor reduction:  Advised patient of need for regular exercise and diet rich with fruits and vegetables to reduce risk of heart attack and stroke.  ?Wt Readings from Last 3 Encounters:  ?11/06/21 139 lb 9.6 oz (63.3 kg)  ? ?3. Immunizations/screenings/ancillary studies ?Immunization History  ?Administered Date(s) Administered  ? Moderna SARS-COV2 Booster Vaccination 07/07/2020, 04/28/2021   ? ?Health Maintenance Due  ?Topic Date Due  ? HIV Screening  Never done  ? Hepatitis C Screening  Never done  ? PAP SMEAR-Modifier  Never done  ? MAMMOGRAM  Never done  ? Zoster Vaccines- Shingrix (1 of 2) Never done  ? COVID-19 Vaccine (2 - Booster for Janssen series) 06/23/2021  ?  ?4. Cervical cancer screening- 2019 ?5. Breast cancer screening-  mammogram - ordering today ?6. Colon cancer screening - 09/2020 - polyps, recheck 3 years  ?7. Skin cancer screening- advised regular sunscreen use. Denies worrisome, changing, or new skin lesions.  ?8. Birth control/STD check- postmenopausal  ?9. Osteoporosis screening- never - ordering today. ?10. Smoking associated screening (lung cancer screening, AAA screen 65-75, UA)- N/A -non- smoker ? ?Problem List Items Addressed This Visit   ? ?  ? Other  ? Postmenopausal estrogen deficiency  ? Relevant Orders  ? DG Bone Density  ? ?Other Visit Diagnoses   ? ? Annual physical exam    -  Primary ?  labs done in January, lipids done in 04/2021. no new concerns today.  ?  ? Encounter for screening mammogram for malignant neoplasm of breast      ? Relevant Orders  ? MM DIAG BREAST TOMO BILATERAL  ? ?  ?Recommended follow up: No follow-ups on file. ?Future Appointments  ?Date Time Provider [place]  ?12/25/2021  1:40 PM Norman Clay, MD [facility] None  ?12/25/2021  4:00 PM Hussami, Christina R, LCSW [facility] None  ? ? ?Jeanie Sewer, NP ? ? ?

## 2021-11-06 NOTE — Patient Instructions (Signed)
Welcome to [facility]! It was a pleasure meeting you today. ? ?As discussed, I have sent orders for a Mammogram and bone density test to [facility], they will call you to schedule. ? ?Please schedule a 6 month follow up visit today for refills and fasting labs. ? ? ?PLEASE NOTE: ? ?If you had any LAB tests please let us know if you have not heard back within a few days. You may see your results on MyChart before we have a chance to review them but we will give you a call once they are reviewed by [place]. If we ordered any REFERRALS today, please let us know if you have not heard from their office within the next week.  ?Let us know through MyChart if you are needing REFILLS, or have your pharmacy send [place] the request. You can also use MyChart to communicate with me or any office staff. ? ?Please try these tips to maintain a healthy lifestyle: ? ?Eat most of your calories during the day when you are active. Eliminate processed foods including packaged sweets (pies, cakes, cookies), reduce intake of potatoes, white bread, white pasta, and white rice. Look for whole grain options, oat flour or almond flour. ? ?Each meal should contain half fruits/vegetables, one quarter protein, and one quarter carbs (no bigger than a computer mouse). ? ?Cut down on sweet beverages. This includes juice, soda, and sweet tea. Also watch fruit intake, though this is a healthier sweet option, it still contains natural sugar! Limit to 3 servings daily. ? ?Drink at least 1 glass of water with each meal and aim for at least 8 glasses per day ? ?Exercise at least 150 minutes every week.  ? ?

## 2021-11-06 NOTE — Addendum Note (Signed)
Addended by: Norman Clay on: 11/06/2021 05:13 PM ? ? Modules accepted: Orders ? ?

## 2021-11-12 ENCOUNTER — Encounter: Payer: Self-pay | Admitting: Family

## 2021-11-12 DIAGNOSIS — Z9889 Other specified postprocedural states: Secondary | ICD-10-CM | POA: Insufficient documentation

## 2021-11-13 ENCOUNTER — Other Ambulatory Visit: Payer: Self-pay | Admitting: Psychiatry

## 2021-12-07 ENCOUNTER — Ambulatory Visit
Admission: RE | Admit: 2021-12-07 | Discharge: 2021-12-07 | Disposition: A | Discharge status: Home / Self Care | Payer: 59 | Source: Ambulatory Visit | Attending: Family | Admitting: Family

## 2021-12-07 DIAGNOSIS — Z780 Asymptomatic menopausal state: Secondary | ICD-10-CM

## 2021-12-07 DIAGNOSIS — Z1231 Encounter for screening mammogram for malignant neoplasm of breast: Secondary | ICD-10-CM

## 2021-12-10 NOTE — Progress Notes (Signed)
Hi Jill Pittman, ? ?Your bone density scan looks great! No Osteoporosis or Osteopenia found. ? ?Recommend daily supplement of 600-1200mg  of Calcium with up to 2,000units of Vitamin D3, along with weight-bearing exercise (walking, jogging, or using a treadmill, elliptical or stairclimber machine) daily to help maintain good bone health.

## 2021-12-13 NOTE — Progress Notes (Signed)
Mammogram normal! Recheck in 1 year.
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
## 2021-12-25 ENCOUNTER — Encounter: Payer: Self-pay | Admitting: Psychiatry

## 2021-12-25 ENCOUNTER — Telehealth (INDEPENDENT_AMBULATORY_CARE_PROVIDER_SITE_OTHER): Payer: 59 | Admitting: Psychiatry

## 2021-12-25 ENCOUNTER — Ambulatory Visit (INDEPENDENT_AMBULATORY_CARE_PROVIDER_SITE_OTHER): Payer: 59 | Admitting: Licensed Clinical Social Worker

## 2021-12-25 DIAGNOSIS — F411 Generalized anxiety disorder: Secondary | ICD-10-CM

## 2021-12-25 DIAGNOSIS — G4700 Insomnia, unspecified: Secondary | ICD-10-CM

## 2021-12-25 DIAGNOSIS — F3341 Major depressive disorder, recurrent, in partial remission: Secondary | ICD-10-CM

## 2021-12-25 MED ORDER — BUSPIRONE HCL 5 MG PO TABS: 5.0000 mg | ORAL_TABLET | Freq: Two times a day (BID) | ORAL | 1 refills | Status: DC

## 2021-12-25 NOTE — Progress Notes (Signed)
Virtual Visit via Video Note  I connected with Jill Pittman on 12/25/21 at  4:00 PM EDT by a video enabled telemedicine application and verified that I am speaking with the correct person using two identifiers.  Location: Patient: home Provider: remote office [place], [place])   I discussed the limitations of evaluation and management by telemedicine and the availability of in person appointments. The patient expressed understanding and agreed to proceed.   I discussed the assessment and treatment plan with the patient. The patient was provided an opportunity to ask questions and all were answered. The patient agreed with the plan and demonstrated an understanding of the instructions.   The patient was advised to call back or seek an in-person evaluation if the symptoms worsen or if the condition fails to improve as anticipated.  I provided 40 minutes of non-face-to-face time during this encounter.   Yandriel Boening R Shona Pardo, LCSW   THERAPIST PROGRESS NOTE  Session Time: 74-440p  Participation Level: Active  Behavioral Response: Neat and Well GroomedAlertAnxious and Depressed  Type of Therapy: Individual Therapy  Treatment Goals addressed: Problem: Decrease depressive symptoms and improve levels of effective functioning Goal: STG: Jill "Ronni" WILL PARTICIPATE IN AT LEAST 80% OF SCHEDULED INDIVIDUAL PSYCHOTHERAPY SESSIONS Outcome: Progressing Goal: LTG: Reduce frequency, intensity, and duration of depression symptoms as evidenced by: SSB input needed on appropriate metric Outcome: Not Progressing Note: Depression symptoms continue to fluctuate situationally  Intervention: PERFORM MOTIVATIONAL INTERVIEWING REGARDING PHYSICAL ACTIVITY ONCE PER WEEK WITH Jill "Ronni" Note: continued Intervention: REVIEW PLEASE SKILLS (TREAT PHYSICAL ILLNESS, BALANCE EATING, AVOID MOOD-ALTERING SUBSTANCES, BALANCE SLEEP AND GET EXERCISE) WITH Jill "Ronni" Note: continued Intervention:  Encourage verbalization of feelings/concerns/expectations Note: Explored/expressed Intervention: Encourage self-care activities Note: continued    ProgressTowards Goals: Not Progressing  Interventions: CBT  Summary: Jill Pittman is a 61 y.o. female who presents with improving symptoms related to depression diagnosis. Pt reports that she continues to have good days and bad days.  Allowed pt to explore and express thoughts and feelings associated with recent life situations and external stressors. Discussed upcoming wedding in June and overall expenses. Pt reports that she feels overwhelmed by the expenses incurred so far planning this trip for the wedding [facility]). Pt states that her new neighborhood needs to go up on association fees to cover repairs. Pt states that she may need to find another job to assist in making ends meet.   Reviewed coping skills for managing depression symptoms and managing stress/anxiety symptoms.   Continued recommendations are as follows: self care behaviors, positive social engagements, focusing on overall work/home/life balance, and focusing on positive physical and emotional wellness.   Suicidal/Homicidal: No--pt admits that she has had some thoughts with no plans or intent to follow through. Thoughts are fleeting   Therapist Response: Pt is continuing to apply interventions learned in session into daily life situations. Pt is currently on track to meet goals utilizing interventions mentioned above. Personal growth and progress intermittent/fluctuating Treatment to continue as indicated.    Plan: Return again in 4 weeks.  Diagnosis: MDD (major depressive disorder), recurrent, in partial remission (HCC)  GAD (generalized anxiety disorder)  Collaboration of Care:pt encouraged to continue care with psychiatrist of record, Dr. Vanetta Shawl.  Patient/Guardian was advised Release of Information must be obtained prior to any record release in order to  collaborate their care with an outside provider. Patient/Guardian was advised if they have not already done so to contact the registration department to sign all necessary forms in order for  [place] to release information regarding their care.   Consent: Patient/Guardian gives verbal consent for treatment and assignment of benefits for services provided during this visit. Patient/Guardian expressed understanding and agreed to proceed.   Ernest Haber Harsha Yusko, LCSW 12/25/2021

## 2021-12-25 NOTE — Patient Instructions (Addendum)
Continue  bupropion 300 mg daily  Continue venlafaxine to 225 mg daily  Start buspar 5 mg twice a day  Discontinue quetiapine Continue trazodone 100 mg at night as needed for sleep Continue hydroxyzine 25 mg daily as needed for anxiety Next appointment: 7/17 at 1 PM

## 2021-12-25 NOTE — Plan of Care (Signed)
  Problem: Decrease depressive symptoms and improve levels of effective functioning Goal: STG: Jill "Ronni" WILL PARTICIPATE IN AT LEAST 80% OF SCHEDULED INDIVIDUAL PSYCHOTHERAPY SESSIONS Outcome: Progressing Goal: LTG: Reduce frequency, intensity, and duration of depression symptoms as evidenced by: SSB input needed on appropriate metric Outcome: Not Progressing Note: Depression symptoms continue to fluctuate situationally  Intervention: PERFORM MOTIVATIONAL INTERVIEWING REGARDING PHYSICAL ACTIVITY ONCE PER WEEK WITH Jill "Ronni" Note: continued Intervention: REVIEW PLEASE SKILLS (TREAT PHYSICAL ILLNESS, BALANCE EATING, AVOID MOOD-ALTERING SUBSTANCES, BALANCE SLEEP AND GET EXERCISE) WITH Jill "Ronni" Note: continued Intervention: Encourage verbalization of feelings/concerns/expectations Note: Explored/expressed Intervention: Encourage self-care activities Note: continued

## 2022-01-22 ENCOUNTER — Other Ambulatory Visit: Payer: Self-pay | Admitting: Psychiatry

## 2022-01-29 ENCOUNTER — Other Ambulatory Visit: Payer: Self-pay | Admitting: Psychiatry

## 2022-02-04 ENCOUNTER — Other Ambulatory Visit: Payer: Self-pay | Admitting: Psychiatry
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
## 2022-02-19 ENCOUNTER — Encounter: Payer: Self-pay | Admitting: Psychiatry

## 2022-02-19 ENCOUNTER — Telehealth: Payer: Self-pay | Admitting: Licensed Clinical Social Worker

## 2022-02-19 ENCOUNTER — Ambulatory Visit (INDEPENDENT_AMBULATORY_CARE_PROVIDER_SITE_OTHER): Payer: Self-pay | Admitting: Licensed Clinical Social Worker

## 2022-02-19 ENCOUNTER — Telehealth (INDEPENDENT_AMBULATORY_CARE_PROVIDER_SITE_OTHER): Payer: 59 | Admitting: Psychiatry

## 2022-02-19 DIAGNOSIS — G4700 Insomnia, unspecified: Secondary | ICD-10-CM

## 2022-02-19 DIAGNOSIS — F411 Generalized anxiety disorder: Secondary | ICD-10-CM | POA: Diagnosis not present

## 2022-02-19 DIAGNOSIS — F330 Major depressive disorder, recurrent, mild: Secondary | ICD-10-CM | POA: Diagnosis not present

## 2022-02-19 DIAGNOSIS — Z91199 Patient's noncompliance with other medical treatment and regimen due to unspecified reason: Secondary | ICD-10-CM

## 2022-02-19 MED ORDER — VENLAFAXINE HCL ER 75 MG PO CP24: 225.0000 mg | ORAL_CAPSULE | Freq: Every day | ORAL | 1 refills | Status: DC

## 2022-02-19 MED ORDER — BUSPIRONE HCL 5 MG PO TABS: 5.0000 mg | ORAL_TABLET | Freq: Three times a day (TID) | ORAL | 0 refills | Status: DC

## 2022-02-19 MED ORDER — TRAZODONE HCL 100 MG PO TABS: 100.0000 mg | ORAL_TABLET | Freq: Every evening | ORAL | 0 refills | Status: DC | PRN

## 2022-02-19 NOTE — Progress Notes (Signed)
LCSW counselor tried to connect with patient for scheduled appointment via MyChart video text request x 2 and email request; also tried to connect via phone without success. LCSW counselor left message for patient to call office number to reschedule OPT appointment.   Attempt 1: Text and email: 3:04p  Attempt 2: Text and email: 3:11p  Attempt 3: phone call: 3:16p left message voice mail  Appointment will be coded as no show 

## 2022-02-19 NOTE — Telephone Encounter (Signed)
LCSW counselor tried to connect with patient for scheduled appointment via MyChart video text request x 2 and email request; also tried to connect via phone without success. LCSW counselor left message for patient to call office number to reschedule OPT appointment.   Attempt 1: Text and email: 3:04p  Attempt 2: Text and email: 3:11p  Attempt 3: phone call: 3:16p left message voice mail  Appointment will be coded as no show

## 2022-03-06 ENCOUNTER — Other Ambulatory Visit: Payer: Self-pay | Admitting: Psychiatry

## 2022-03-12 ENCOUNTER — Other Ambulatory Visit: Payer: Self-pay | Admitting: Psychiatry

## 2022-03-16 ENCOUNTER — Other Ambulatory Visit: Payer: Self-pay | Admitting: Psychiatry

## 2022-03-19 ENCOUNTER — Other Ambulatory Visit: Payer: Self-pay | Admitting: Psychiatry

## 2022-03-22 ENCOUNTER — Ambulatory Visit: Payer: 59 | Admitting: Psychiatry

## 2022-03-30 ENCOUNTER — Other Ambulatory Visit: Payer: Self-pay | Admitting: Psychiatry

## 2022-04-30 ENCOUNTER — Encounter: Payer: Self-pay | Admitting: *Deleted

## 2022-05-04 ENCOUNTER — Ambulatory Visit (INDEPENDENT_AMBULATORY_CARE_PROVIDER_SITE_OTHER): Payer: 59 | Admitting: Licensed Clinical Social Worker

## 2022-05-04 ENCOUNTER — Encounter (HOSPITAL_COMMUNITY): Payer: Self-pay | Admitting: Licensed Clinical Social Worker

## 2022-05-04 ENCOUNTER — Encounter: Payer: Self-pay | Admitting: Adult Health

## 2022-05-04 ENCOUNTER — Ambulatory Visit (INDEPENDENT_AMBULATORY_CARE_PROVIDER_SITE_OTHER): Payer: 59 | Admitting: Adult Health

## 2022-05-04 VITALS — BP 126/80 | HR 101 | Ht 65.0 in | Wt 138.0 lb

## 2022-05-04 DIAGNOSIS — F410 Panic disorder [episodic paroxysmal anxiety] without agoraphobia: Secondary | ICD-10-CM

## 2022-05-04 DIAGNOSIS — F330 Major depressive disorder, recurrent, mild: Secondary | ICD-10-CM

## 2022-05-04 DIAGNOSIS — G4700 Insomnia, unspecified: Secondary | ICD-10-CM | POA: Diagnosis not present

## 2022-05-04 DIAGNOSIS — F411 Generalized anxiety disorder: Secondary | ICD-10-CM

## 2022-05-04 DIAGNOSIS — F430 Acute stress reaction: Secondary | ICD-10-CM

## 2022-05-04 MED ORDER — BUPROPION HCL ER (XL) 300 MG PO TB24: 300.0000 mg | ORAL_TABLET | Freq: Every day | ORAL | 1 refills | Status: DC

## 2022-05-04 MED ORDER — TRAZODONE HCL 100 MG PO TABS: 100.0000 mg | ORAL_TABLET | Freq: Every evening | ORAL | 0 refills | Status: DC | PRN

## 2022-05-04 MED ORDER — HYDROXYZINE HCL 25 MG PO TABS: 25.0000 mg | ORAL_TABLET | Freq: Every day | ORAL | 0 refills | Status: DC | PRN

## 2022-05-04 NOTE — Plan of Care (Signed)
  Problem: Decrease depressive symptoms and improve levels of effective functioning Goal: STG: [name] WILL PARTICIPATE IN AT LEAST 80% OF SCHEDULED INDIVIDUAL PSYCHOTHERAPY SESSIONS Outcome: Progressing Goal: LTG: Reduce frequency, intensity, and duration of depression symptoms as evidenced by: SSB input needed on appropriate metric Outcome: Progressing Intervention: REVIEW PLEASE SKILLS (TREAT PHYSICAL ILLNESS, BALANCE EATING, AVOID MOOD-ALTERING SUBSTANCES, BALANCE SLEEP AND GET EXERCISE) WITH [name] Note: Reviewed    Problem: Anxiety  Goal:  Reduce overall frequency, intensity, and duration of the anxiety and panic episodes so that daily functioning is not impaired per pt self report 3 out of 5 sessions documented.   Outcome: Not Progressing Goal: Learn and implement coping skills that result in a reduction of anxiety and worry, and improve daily functioning per pt report 3 out of 5 sessions documented  Outcome: Progressing Intervention: Discuss self-management skills Note: Reviewed panic and anxiety management strategies

## 2022-05-04 NOTE — Progress Notes (Signed)
[facility] MD/PA/NP Initial Note  05/04/2022 3:29 PM Jill Pittman  MRN:  [id number]  Chief Complaint:   HPI:   Patient seen today for initial psychiatric evaluation.   Previously seen by Dr. Vanetta Shawl for medication management. Reports switching providers due to locations.  Describes mood today as "ok". Pleasant. Denies tearfulness. Mood symptoms - reports depression, anxiety and irritability at times. 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 Denies obsessive tendencies. Reports panic attacks. Mood is variable - "it fluctuates".  Stating "I'm doing ok today" - "other days I don't think so".  Feels like medications are helpful. Still feels like there is room for mood improvement - "I'm feeling good about 1/2 of the week and then I'm down". Reports multiple losses - family members which have also impacted her mood - "it broke me and I haven't been able to put myself back together". Feels like current medications are helpful, but questions the amount of medication she is on or if it works. Willing to consider other options. Planning to see a therapist - upcoming appointment. Stable interest and motivation. Taking medications as prescribed.  Energy levels "haven't been bad" - "able to get out of the bed and function". Active, does not have a regular exercise routine.   Enjoys some usual interests and activities. Married 24 years. Has one step son. Friends and family in the area. Appetite adequate - not what it used to be. Weight stable 138 - 65". Sleeps well most nights. Averages 8 hours. Focus and concentration difficulties - "a little better, but still all over the place". Completing tasks. Managing aspects of household. Works for [employer] - 40 hours - hybrid schedule. Denies SI or HI.  Denies AH or VH. Denies self harm. Denies substance use.  Previous medication trials: Lexapro, fluoxetine, bupropion, quetiapine (fatigue)  Visit Diagnosis:    ICD-10-CM   1. MDD (major depressive disorder),  recurrent episode, mild (HCC)  F33.0     2. GAD (generalized anxiety disorder)  F41.1     3. Panic attack as reaction to stress  F41.0    F43.0     4. Insomnia, unspecified type  G47.00       Past Psychiatric History: Denies any family history of mental illness.   Past Medical History:  Past Medical History:  Diagnosis Date   History of craniotomy    Hx of resection of meningioma    Hyperlipidemia    Prediabetes    No past surgical history on file.  Family Psychiatric History: Denies any family history of mental illness.   Family History:  Family History  Problem Relation Age of Onset   Alcohol abuse Mother    Breast cancer Maternal Grandmother    Alcohol abuse Maternal Grandfather     Social History:  Social History   Socioeconomic History   Marital status: Married    Spouse name: Not on file   Number of children: Not on file   Years of education: Not on file   Highest education level: Not on file  Occupational History   Not on file  Tobacco Use   Smoking status: Not on file   Smokeless tobacco: Not on file  Substance and Sexual Activity   Alcohol use: Not on file   Drug use: Not on file   Sexual activity: Not on file  Other Topics Concern   Not on file  Social History Narrative   pt married, moved from [place] during pandemic, rented RV and travelled all over [place] for 1  year   Social Determinants of Health   Financial Resource Strain: Not on file  Food Insecurity: Not on file  Transportation Needs: Not on file  Physical Activity: Not on file  Stress: Not on file  Social Connections: Not on file    Allergies:  Allergies  Allergen Reactions   Penicillins Rash    Metabolic Disorder Labs: No results found for: "HGBA1C", "MPG" No results found for: "PROLACTIN" No results found for: "CHOL", "TRIG", "HDL", "CHOLHDL", "VLDL", "LDLCALC" No results found for: "TSH"  Therapeutic Level Labs: No results found for: "LITHIUM" No results found for:  "VALPROATE" No results found for: "CBMZ"  Current Medications: Current Outpatient Medications  Medication Sig Dispense Refill   buPROPion (WELLBUTRIN XL) 300 MG 24 hr tablet Take 1 tablet (300 mg total) by mouth daily. 90 tablet 1   busPIRone (BUSPAR) 5 MG tablet Take 1 tablet (5 mg total) by mouth 3 (three) times daily. 90 tablet 1   hydrOXYzine (ATARAX) 25 MG tablet Take 1 tablet (25 mg total) by mouth daily as needed. for anxiety 90 tablet 0   levothyroxine (SYNTHROID) 150 MCG tablet Take 150 mcg by mouth daily before breakfast.     metFORMIN (GLUCOPHAGE) 500 MG tablet Take 1 tablet by mouth 2 (two) times daily with a meal.     simvastatin (ZOCOR) 10 MG tablet Take 10 mg by mouth daily.     testosterone cypionate (DEPOTESTOTERONE CYPIONATE) 100 MG/ML injection Inject into the muscle every 30 (thirty) days. For IM use only     traZODone (DESYREL) 100 MG tablet Take 1 tablet (100 mg total) by mouth at bedtime as needed. 90 tablet 0   venlafaxine XR (EFFEXOR-XR) 75 MG 24 hr capsule Take 3 capsules (225 mg total) by mouth daily with breakfast. 270 capsule 1   No current facility-administered medications for this visit.    Medication Side Effects: none  Orders placed this visit:  No orders of the defined types were placed in this encounter.   Psychiatric Specialty Exam:  Review of Systems  Musculoskeletal:  Negative for gait problem.  Neurological:  Negative for tremors.  Psychiatric/Behavioral:         Please refer to HPI    Blood pressure 126/80, pulse (!) 101, height 5\' 5"  (1.651 m), weight 138 lb (62.6 kg).Body mass index is 22.96 kg/m.  General Appearance: Casual and Neat  Eye Contact:  Good  Speech:  Clear and Coherent and Normal Rate  Volume:  Normal  Mood:  Euthymic  Affect:  Appropriate and Congruent  Thought Process:  Coherent and Descriptions of Associations: Intact  Orientation:  Full (Time, Place, and Person)  Thought Content: Logical   Suicidal Thoughts:  No   Homicidal Thoughts:  No  Memory:  WNL  Judgement:  Good  Insight:  Good  Psychomotor Activity:  Normal  Concentration:  Concentration: Good and Attention Span: Good  Recall:  Good  Fund of Knowledge: Good  Language: Good  Assets:  Communication Skills Desire for Improvement Financial Resources/Insurance Housing Intimacy Leisure Time Physical Health Resilience Social Support Talents/Skills Transportation Vocational/Educational  ADL's:  Intact  Cognition: WNL  Prognosis:  Good   Screenings:  PHQ2-9    Flowsheet Row Office Visit from 11/06/2021 in [facility] from 10/30/2021 in [facility] Counselor from 05/29/2021 in [facility] Counselor from 03/10/2021 in [facility] Video Visit from 12/12/2020 in [facility]  PHQ-2 Total Score 0 2 2  2 2  PHQ-9 Total Score -- 7 -- -- --      Flowsheet Row Counselor from 05/04/2022 in [facility] Counselor from 10/30/2021 in [facility] Counselor from 05/29/2021 in [facility] Error: Question 6 not populated No Risk Error: Q3, 4, or 5 should not be populated when Q2 is No       Receiving Psychotherapy: Yes   Treatment Plan/Recommendations:   Plan:  PDMP reviewed  Add Rexulti 0.5mg  daily  Buproprion XL300 mg daily (limited benefit from 450 mg) Venlafaxine 225 mg daily   Buspar 5 mg three times a day   Trazodone 100 mg at night as needed for sleep Hydroxyzine 25 mg daily as needed for anxiety   Patient advised to contact office with any questions, adverse effects, or acute worsening in signs and symptoms.  Discussed potential metabolic side effects associated with atypical antipsychotics, as well as potential risk for movement side effects. Advised pt to contact office if  movement side effects occur.   RTC 4 weeks  Aloha Gell, NP

## 2022-05-04 NOTE — Progress Notes (Signed)
Virtual Visit via Video Note  I connected with Jill Pittman on 05/04/22 at  8:00 AM EDT by a video enabled telemedicine application and verified that I am speaking with the correct person using two identifiers.  Location: Patient: home Provider: remote office [place], [place])   I discussed the limitations of evaluation and management by telemedicine and the availability of in person appointments. The patient expressed understanding and agreed to proceed.   I discussed the assessment and treatment plan with the patient. The patient was provided an opportunity to ask questions and all were answered. The patient agreed with the plan and demonstrated an understanding of the instructions.   The patient was advised to call back or seek an in-person evaluation if the symptoms worsen or if the condition fails to improve as anticipated.  I provided 40 minutes of non-face-to-face time during this encounter.   [place], LCSW   THERAPIST PROGRESS NOTE  Session Time: [phone]  Participation Level: Active  Behavioral Response: Neat and Well GroomedAlertAnxious and Depressed  Type of Therapy: Individual Therapy  Treatment Goals addressed: Problem: Decrease depressive symptoms and improve levels of effective functioning Goal: STG: Jill "Ronni" WILL PARTICIPATE IN AT LEAST 80% OF SCHEDULED INDIVIDUAL PSYCHOTHERAPY SESSIONS Outcome: Progressing Goal: LTG: Reduce frequency, intensity, and duration of depression symptoms as evidenced by: SSB input needed on appropriate metric Outcome: Progressing Intervention: REVIEW PLEASE SKILLS (TREAT PHYSICAL ILLNESS, BALANCE EATING, AVOID MOOD-ALTERING SUBSTANCES, BALANCE SLEEP AND GET EXERCISE) WITH Jill "Ronni" Note: Reviewed    Problem: Anxiety  Goal:  Reduce overall frequency, intensity, and duration of the anxiety and panic episodes so that daily functioning is not impaired per pt self report 3 out of 5 sessions documented.   Outcome:  Not Progressing Goal: Learn and implement coping skills that result in a reduction of anxiety and worry, and improve daily functioning per pt report 3 out of 5 sessions documented  Outcome: Progressing Intervention: Discuss self-management skills Note: Reviewed panic and anxiety management strategies   ProgressTowards Goals: Not Progressing  Interventions: CBT  Summary: Jill Pittman is a 61 y.o. female who presents with improving symptoms related to depression diagnosis. Pt reports that she continues to have good days and bad days.  Allowed pt to explore and express thoughts and feelings associated with recent life situations and external stressors. Patient reports that overall depression symptoms have been stable, but she has noticed an increase in anxiety symptoms. Patient reports that recently she experienced a panic attack which included symptoms of sweating, shaking, feeling chills, in feeling a sense of doom. Patient reports that she called 911, and the paramedics came to check everything. Patient states that she was not transported to the hospital because the paramedics felt that it was anxiety related. Reviewed extensive lease management strategies/coping skills regarding anxiety and panic attacks. Allowed patient to identify behaviors and activities that help manage her overall stress levels. Encouraged patient to continue engaging in these identified activities.  Patient reports that her son recently got married in [place]--this has been a big source of stress for her period patient is relieved that the wedding is over, but now feels the stress of finances. Patient reports that her husband still has not found a job, and this is a big external stressor for her.  Reviewed coping skills for managing depression symptoms and managing stress/anxiety symptoms.   Continued recommendations are as follows: self care behaviors, positive social engagements, focusing on overall work/home/life  balance, and focusing on positive physical and emotional  wellness.   Suicidal/Homicidal: No  Therapist Response: Pt is continuing to apply interventions learned in session into daily life situations. Pt is currently on track to meet goals utilizing interventions mentioned above. Personal growth and progress intermittent/fluctuating Treatment to continue as indicated.   Provided pt with psychoeducational resources via MyChart.  Plan: Return again in 4 weeks.  Diagnosis:  Encounter Diagnoses  Name Primary?   MDD (major depressive disorder), recurrent episode, mild (HCC) Yes   GAD (generalized anxiety disorder)    Panic attack as reaction to stress      Collaboration of Care:pt encouraged to continue care with psychiatrist of record, Dr. Vanetta Shawl.  Patient/Guardian was advised Release of Information must be obtained prior to any record release in order to collaborate their care with an outside provider. Patient/Guardian was advised if they have not already done so to contact the registration department to sign all necessary forms in order for [place] to release information regarding their care.   Consent: Patient/Guardian gives verbal consent for treatment and assignment of benefits for services provided during this visit. Patient/Guardian expressed understanding and agreed to proceed.   Ernest Haber Jill Sperry, LCSW 05/04/2022

## 2022-05-06 DIAGNOSIS — E039 Hypothyroidism, unspecified: Secondary | ICD-10-CM | POA: Insufficient documentation

## 2022-05-06 NOTE — Progress Notes (Unsigned)
   Patient ID: Jill Pittman, female    DOB: 10/10/1960, 61 y.o.   MRN: [id number]  No chief complaint on file.   HPI: Hypothyroidism: Patient presents today for followup of Hypothyroidism.  Patient reports positive compliance with daily medication.  Patient denies any of the following symptoms: fatigue, cold intolerance, constipation, weight gain or inability to lose weight, muscle weakness, mental slowing, dry hair and skin. Hyperlipidemia: Patient is currently maintained on the following medication for hyperlipidemia: Zocor. Patient denies myalgia or other side effects. Patient reports good compliance with low fat/low cholesterol diet.  Prediabetes:  pt has been on Metformin 500mg  bid for years. Last A1C  a year ago was 6.0.  Assessment & Plan:    Problem List Items Addressed This Visit   None   Subjective:    Outpatient Medications Prior to Visit  Medication Sig Dispense Refill  . buPROPion (WELLBUTRIN XL) 300 MG 24 hr tablet Take 1 tablet (300 mg total) by mouth daily. 90 tablet 1  . busPIRone (BUSPAR) 5 MG tablet Take 1 tablet (5 mg total) by mouth 3 (three) times daily. 90 tablet 1  . hydrOXYzine (ATARAX) 25 MG tablet Take 1 tablet (25 mg total) by mouth daily as needed. for anxiety 90 tablet 0  . levothyroxine (SYNTHROID) 150 MCG tablet Take 150 mcg by mouth daily before breakfast.    . metFORMIN (GLUCOPHAGE) 500 MG tablet Take 1 tablet by mouth 2 (two) times daily with a meal.    . simvastatin (ZOCOR) 10 MG tablet Take 10 mg by mouth daily.    Marland Kitchen testosterone cypionate (DEPOTESTOTERONE CYPIONATE) 100 MG/ML injection Inject into the muscle every 30 (thirty) days. For IM use only    . traZODone (DESYREL) 100 MG tablet Take 1 tablet (100 mg total) by mouth at bedtime as needed. 90 tablet 0  . venlafaxine XR (EFFEXOR-XR) 75 MG 24 hr capsule Take 3 capsules (225 mg total) by mouth daily with breakfast. 270 capsule 1   No facility-administered medications prior to visit.   Past  Medical History:  Diagnosis Date  . History of craniotomy   . Hx of resection of meningioma   . Hyperlipidemia   . Prediabetes    No past surgical history on file. Allergies  Allergen Reactions  . Penicillins Rash      Objective:    Physical Exam Vitals and nursing note reviewed.  Constitutional:      Appearance: Normal appearance.  Cardiovascular:     Rate and Rhythm: Normal rate and regular rhythm.  Pulmonary:     Effort: Pulmonary effort is normal.     Breath sounds: Normal breath sounds.  Musculoskeletal:        General: Normal range of motion.  Skin:    General: Skin is warm and dry.  Neurological:     Mental Status: She is alert.  Psychiatric:        Mood and Affect: Mood normal.        Behavior: Behavior normal.  There were no vitals taken for this visit. Wt Readings from Last 3 Encounters:  11/06/21 139 lb 9.6 oz (63.3 kg)       Jeanie Sewer, NP

## 2022-05-07 ENCOUNTER — Encounter: Payer: Self-pay | Admitting: Family

## 2022-05-07 ENCOUNTER — Ambulatory Visit (INDEPENDENT_AMBULATORY_CARE_PROVIDER_SITE_OTHER): Payer: 59 | Admitting: Mental Health

## 2022-05-07 ENCOUNTER — Ambulatory Visit: Payer: 59 | Admitting: Family

## 2022-05-07 VITALS — BP 110/73 | HR 81 | Temp 97.8°F | Ht 65.0 in | Wt 139.8 lb

## 2022-05-07 DIAGNOSIS — E039 Hypothyroidism, unspecified: Secondary | ICD-10-CM | POA: Diagnosis not present

## 2022-05-07 DIAGNOSIS — L720 Epidermal cyst: Secondary | ICD-10-CM | POA: Insufficient documentation

## 2022-05-07 DIAGNOSIS — F331 Major depressive disorder, recurrent, moderate: Secondary | ICD-10-CM

## 2022-05-07 DIAGNOSIS — E782 Mixed hyperlipidemia: Secondary | ICD-10-CM | POA: Diagnosis not present

## 2022-05-07 DIAGNOSIS — F330 Major depressive disorder, recurrent, mild: Secondary | ICD-10-CM | POA: Diagnosis not present

## 2022-05-07 DIAGNOSIS — F411 Generalized anxiety disorder: Secondary | ICD-10-CM | POA: Insufficient documentation

## 2022-05-07 DIAGNOSIS — R7303 Prediabetes: Secondary | ICD-10-CM | POA: Diagnosis not present

## 2022-05-07 LAB — COMPREHENSIVE METABOLIC PANEL
ALT: 24 U/L (ref 0–35)
AST: 17 U/L (ref 0–37)
Albumin: 4.3 g/dL (ref 3.5–5.2)
Alkaline Phosphatase: 89 U/L (ref 39–117)
BUN: 13 mg/dL (ref 6–23)
CO2: 25 mEq/L (ref 19–32)
Calcium: 8.9 mg/dL (ref 8.4–10.5)
Chloride: 105 mEq/L (ref 96–112)
Creatinine, Ser: 0.68 mg/dL (ref 0.40–1.20)
GFR: 95.32 mL/min (ref >60.00)
Glucose, Bld: 119 mg/dL — ABNORMAL HIGH (ref 70–99)
Potassium: 4.2 mEq/L (ref 3.5–5.1)
Sodium: 138 mEq/L (ref 135–145)
Total Bilirubin: 0.4 mg/dL (ref 0.2–1.2)
Total Protein: 7.2 g/dL (ref 6.0–8.3)

## 2022-05-07 LAB — T4, FREE: Free T4: 1.48 ng/dL (ref 0.60–1.60)

## 2022-05-07 LAB — LDL CHOLESTEROL, DIRECT: Direct LDL: 104 mg/dL

## 2022-05-07 LAB — LIPID PANEL
Cholesterol: 178 mg/dL (ref 0–200)
HDL: 57.6 mg/dL (ref >39.00)
NonHDL: 120.89
Total CHOL/HDL Ratio: 3
Triglycerides: 236 mg/dL — ABNORMAL HIGH (ref 0.0–149.0)
VLDL: 47.2 mg/dL — ABNORMAL HIGH (ref 0.0–40.0)

## 2022-05-07 LAB — TSH: TSH: 0.01 u[IU]/mL — ABNORMAL LOW (ref 0.35–5.50)

## 2022-05-07 NOTE — Assessment & Plan Note (Signed)
   chronic  taking Wellbutrin & Effexor qd, Trazodone for sleep  no refills needed today  f/u in 6 mos

## 2022-05-07 NOTE — Assessment & Plan Note (Signed)
   chronic  taking zocor qd  rechecking lipids today  f/u in 6 mos

## 2022-05-07 NOTE — Assessment & Plan Note (Signed)
   chronic  pt taking Levothyroxine qd  checking hormones today  f/u in 6 mos

## 2022-05-07 NOTE — Assessment & Plan Note (Addendum)
   chronic  last A1C at 6.0  taking Metformin 500mg  bid  rechecking A1C today  f/u in 6 mos

## 2022-05-07 NOTE — Patient Instructions (Signed)
It was very nice to see you today!    I will review your lab results via MyChart in a 2-3 days.  Let us know when you need refills.  See you in 6 months or sooner if needed!     PLEASE NOTE:  If you had any lab tests please let us know if you have not heard back within a few days. You may see your results on MyChart before we have a chance to review them but we will give you a call once they are reviewed by [place]. If we ordered any referrals today, please let us know if you have not heard from their office within the next week.

## 2022-05-07 NOTE — Progress Notes (Signed)
[facility] Initial Adult Exam  Name: Jill Pittman Date: 05/07/2022 MRN: [id number] DOB: Feb 14, 1961 PCP: Dulce Sellar, NP  Time spent:  51 minutes  Reason for Visit /Presenting Problem: she stated she moved to this area about 2 years ago. Her and her husband moved from [place] after living in an RV for about 3 years during Covid. 2008 lost her father, mother and sister all passed within a year and half time span.  2009, she her cat passed, felt "broken" since then. Having marital issues, plans to go to couples counseling. Her depression persists even though she takes her medication is taken regularly.  Ruminates at night which impairs sleep. Her resentment towards her husband due to inconsistency with employment; works Research scientist (physical sciences) currently. She works full time and has consistently.  She wants to allow herself to give her husband more opportunities to complete tasks at home instead of doing them herself.     Mental Status Exam:    Appearance:    Casual     Behavior:   Appropriate  Motor:   WNL  Speech/Language:    Clear and Coherent  Affect:   Full range   Mood:   Anxious, pleasant  Thought process:   Logical, linear, goal directed  Thought content:     WNL  Sensory/Perceptual disturbances:     none  Orientation:   x4  Attention:   Good  Concentration:   Good  Memory:   Intact  Fund of knowledge:    Consistent with age and development  Insight:     Good  Judgment:    Good  Impulse Control:   Good     Reported Symptoms:   depressed mood, anxiety, rumination, sleep disturbance  Risk Assessment: Danger to Self:  No Self-injurious Behavior: No Danger to Others: No Duty to Warn:no Physical Aggression / Violence:No  Access to Firearms a concern: No  Gang Involvement:No  Patient / guardian was educated about steps to take if suicide or homicide risk level increases between visits: yes While future psychiatric events cannot be accurately predicted, the patient  does not currently require acute inpatient psychiatric care and does not currently meet [facility] involuntary commitment criteria.  Substance Abuse History: Current substance abuse: No     Past Psychiatric History:   Previous psychological history is significant for anxiety and depression Outpatient Providers: [facility] History of Psych Hospitalization: No  Psychological Testing: none   Family History:  Family History  Problem Relation Age of Onset   Alcohol abuse Mother    Breast cancer Maternal Grandmother    Alcohol abuse Maternal Grandfather     Living situation: the patient lives with their family  Sexual Orientation:  Straight  Relationship Status: married              If a parent, number of children / ages: adult son  Lawyer; 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 90 tablet 1   busPIRone (BUSPAR) 5 MG tablet Take 1 tablet (5 mg total) by mouth 3 (three) times daily. 90 tablet 1   hydrOXYzine (ATARAX) 25 MG tablet Take 1 tablet (25 mg total) by mouth daily as needed. for anxiety 90 tablet 0   levothyroxine (SYNTHROID) 150 MCG tablet Take 150 mcg by mouth daily before breakfast.     metFORMIN (GLUCOPHAGE) 500 MG tablet Take 1 tablet by mouth 2 (two) times daily with a meal.     simvastatin (ZOCOR) 10 MG tablet Take 10 mg by mouth daily.     traZODone (DESYREL) 100 MG tablet Take 1  tablet (100 mg total) by mouth at bedtime as needed. 90 tablet 0   venlafaxine XR (EFFEXOR-XR) 75 MG 24 hr capsule Take 3 capsules (225 mg total) by mouth daily with breakfast. 270 capsule 1   No current facility-administered medications for this visit.    Allergies  Allergen Reactions   Penicillins Rash    Diagnoses:    ICD-10-CM   1. MDD (major depressive disorder), recurrent episode, mild ([place])  F33.0       Plan of Care: TBD   [place], [facility]

## 2022-05-07 NOTE — Assessment & Plan Note (Signed)
   chronic  taking Buspar  reports having a panic attack at work, was unsure at the time,went home, called EMS, BP & ECG wnl, elevated HR & SOB, but then all sx resolved. pt did not think to take her Buspar as she didn't know what was happening  Advised on all panic attack sx, continue to monitor  f/u in 6 mos or prn

## 2022-05-09 NOTE — Progress Notes (Signed)
Your glucose (sugar) is a little high, and I apologize, I forgot to order the A1C as discussed to check on diabetes, so this can be done anytime if you want to come back for a non-fasting, lab only appointment or we can do a fingerstick check and discuss at the same time (this would be a nurse visit).  Your electrolytes, kidney and liver function, blood count, thyroid (TSH low, but actual hormone in normal range), and cholesterol numbers are all good except for triglycerides which correlates with high glucose usually - need to cut back on carbs and all sweets!  Remember to drink at least 2 liters of water every day & shoot for 30 minutes of exercise daily!

## 2022-05-15 ENCOUNTER — Ambulatory Visit: Payer: 59 | Admitting: Psychiatry

## 2022-05-23 ENCOUNTER — Ambulatory Visit: Payer: 59 | Admitting: Podiatry

## 2022-05-23 DIAGNOSIS — B351 Tinea unguium: Secondary | ICD-10-CM

## 2022-05-23 MED ORDER — TERBINAFINE HCL 250 MG PO TABS: ORAL_TABLET | ORAL | 0 refills | Status: DC

## 2022-05-24 ENCOUNTER — Encounter: Payer: Self-pay | Admitting: Family

## 2022-05-24 DIAGNOSIS — R7303 Prediabetes: Secondary | ICD-10-CM

## 2022-05-24 NOTE — Progress Notes (Signed)
Subjective:   Patient ID: Jill Pittman, female   DOB: 61 y.o.   MRN: [id number]   HPI Patient has developed nail fungus with yellow discoloration of a number of beds big toes and fifth toes and fourth on the right.  States they are nontender but she is concerned about appearance and the fact the nails appear thickened.  Patient does not smoke likes to be active   Review of Systems  All other systems reviewed and are negative.       Objective:  Physical Exam Vitals and nursing note reviewed.  Constitutional:      Appearance: She is well-developed.  Pulmonary:     Effort: Pulmonary effort is normal.  Musculoskeletal:        General: Normal range of motion.  Skin:    General: Skin is warm.  Neurological:     Mental Status: She is alert.     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

## 2022-05-25 ENCOUNTER — Ambulatory Visit (INDEPENDENT_AMBULATORY_CARE_PROVIDER_SITE_OTHER): Payer: 59 | Admitting: Mental Health

## 2022-05-25 DIAGNOSIS — F330 Major depressive disorder, recurrent, mild: Secondary | ICD-10-CM | POA: Diagnosis not present

## 2022-05-25 NOTE — Progress Notes (Signed)
[facility] Psychotherapy note  Name: Jill Pittman Date: 05/25/2022 MRN: [id number] DOB: 1960/08/07 PCP: Jeanie Sewer, NP  Time spent:  54 minutes  Treatment:   ind. therapy    Mental Status Exam:    Appearance:    Casual     Behavior:   Appropriate  Motor:   WNL  Speech/Language:    Clear and Coherent  Affect:   Full range   Mood:   Anxious, pleasant  Thought process:   Logical, linear, goal directed  Thought content:     WNL  Sensory/Perceptual disturbances:     none  Orientation:   x4  Attention:   Good  Concentration:   Good  Memory:   Intact  Fund of knowledge:    Consistent with age and development  Insight:     Good  Judgment:    Good  Impulse Control:   Good     Reported Symptoms:   depressed mood, anxiety, rumination, sleep disturbance  Risk Assessment: Danger to Self:  No Self-injurious Behavior: No Danger to Others: No Duty to Warn:no Physical Aggression / Violence:No  Access to Firearms a concern: No  Gang Involvement:No  Patient / guardian was educated about steps to take if suicide or homicide risk level increases between visits: yes While future psychiatric events cannot be accurately predicted, the patient does not currently require acute inpatient psychiatric care and does not currently meet [facility] involuntary commitment criteria.   Medical History/Surgical History: Past Medical History:  Diagnosis Date   History of craniotomy    Hx of resection of meningioma    Hyperlipidemia    Prediabetes      Medications: Current Outpatient Medications  Medication Sig Dispense Refill   buPROPion (WELLBUTRIN XL) 300 MG 24 hr tablet Take 1 tablet (300 mg total) by mouth daily. 90 tablet 1   busPIRone (BUSPAR) 5 MG tablet Take 1 tablet (5 mg total) by mouth 3 (three) times daily. 90 tablet 1   hydrOXYzine (ATARAX) 25 MG tablet Take 1 tablet (25 mg total) by mouth daily as needed. for anxiety 90 tablet 0   levothyroxine  (SYNTHROID) 150 MCG tablet Take 150 mcg by mouth daily before breakfast.     metFORMIN (GLUCOPHAGE) 500 MG tablet Take 1 tablet by mouth 2 (two) times daily with a meal.     simvastatin (ZOCOR) 10 MG tablet Take 10 mg by mouth daily.     terbinafine (LAMISIL) 250 MG tablet Please take one a day x 7days, repeat every 4 weeks x 4 months 28 tablet 0   traZODone (DESYREL) 100 MG tablet Take 1 tablet (100 mg total) by mouth at bedtime as needed. 90 tablet 0   venlafaxine XR (EFFEXOR-XR) 75 MG 24 hr capsule Take 3 capsules (225 mg total) by mouth daily with breakfast. 270 capsule 1   No current facility-administered medications for this visit.    Allergies  Allergen Reactions   Penicillins Rash     Subjective: Patient arrived on time for today's session.  She reports continued job stress going on to give detailed examples.  At the point, she is considering another job option and has an interview later today in which she is hopeful.  Facilitated her identifying thoughts associated with her current job and how having a new job potentially may help with her subsequent feelings of distress that add to her depression.  She went on to share some of the ongoing marital relational strain that she has with her husband, she  stated that she is not as he has been trying more recently, acknowledging his hesitance to find a full-time job as he has been working part-time in food delivery.  She shared how he has mood swings giving some more history related.  Provide support throughout utilizing motivational interviewing to continue to assist patient in identifying needs.   Interventions: Further assessment, CBT, motivational interviewing   Diagnoses:    ICD-10-CM   1. MDD (major depressive disorder), recurrent episode, mild (HCC)  F33.0         Plan:  Patient is to use CBT, mindfulness and coping skills to help manage / decrease symptoms associated with their diagnosis.   Long-term goals:   The patient  will report sustained reduction in symptoms of depression for at least 3 consecutive months.    Short-term goal:  The patient will learn and apply coping skills as identified in session.       2.   The patient will identify outlets for stress mgmt.       3.   Patient to identify and engage in effective communication in her marital relationship related to stressors as they arise.       4.   Patient to care for herself by getting adequate rest.   Waldron Session, [facility]

## 2022-06-01 ENCOUNTER — Telehealth (INDEPENDENT_AMBULATORY_CARE_PROVIDER_SITE_OTHER): Payer: 59 | Admitting: Adult Health

## 2022-06-01 ENCOUNTER — Encounter: Payer: Self-pay | Admitting: Adult Health

## 2022-06-01 DIAGNOSIS — F330 Major depressive disorder, recurrent, mild: Secondary | ICD-10-CM | POA: Diagnosis not present

## 2022-06-01 DIAGNOSIS — F410 Panic disorder [episodic paroxysmal anxiety] without agoraphobia: Secondary | ICD-10-CM

## 2022-06-01 DIAGNOSIS — F411 Generalized anxiety disorder: Secondary | ICD-10-CM

## 2022-06-01 DIAGNOSIS — G4700 Insomnia, unspecified: Secondary | ICD-10-CM | POA: Diagnosis not present

## 2022-06-01 DIAGNOSIS — F430 Acute stress reaction: Secondary | ICD-10-CM

## 2022-06-01 MED ORDER — BREXPIPRAZOLE 1 MG PO TABS: 1.0000 mg | ORAL_TABLET | Freq: Every day | ORAL | 2 refills | Status: DC

## 2022-06-01 NOTE — Progress Notes (Signed)
Jill Pittman [id number] Jul 10, 1961 61 y.o.  Virtual Visit via Video Note  I connected with pt @ on 06/01/22 at  2:40 PM EDT by a video enabled telemedicine application and verified that I am speaking with the correct person using two identifiers.   I discussed the limitations of evaluation and management by telemedicine and the availability of in person appointments. The patient expressed understanding and agreed to proceed.  I discussed the assessment and treatment plan with the patient. The patient was provided an opportunity to ask questions and all were answered. The patient agreed with the plan and demonstrated an understanding of the instructions.   The patient was advised to call back or seek an in-person evaluation if the symptoms worsen or if the condition fails to improve as anticipated.  I provided 25 minutes of non-face-to-face time during this encounter.  The patient was located at home.  The provider was located at [facility].   Dorothyann Gibbs, NP   Subjective:   Patient ID:  Jill Pittman is a 61 y.o. (DOB [birth year]) female.  Chief Complaint: No chief complaint on file.   HPI Jill Pittman presents for follow-up of MDD, GAD, insomnia and panic attacks.  Previously seen by Dr. Vanetta Pittman for medication management. Reports switching providers due to locations.  Describes mood today as "a little better". Pleasant. Reports some tearfulness. Mood symptoms - reports depression, anxiety and irritability at times - "the past few weeks have been better". Reports some worry and rumination. Denies recent panic attacks - "close one". Mood has improved - more consistent the last 2 weeks - more level over oast few weeks.  Stating "I feel more positive since starting Rexulti". Seeing therapist - upcoming appointment. Stable interest and motivation. Taking medications as prescribed.  Energy levels "better". Active, does not have a regular exercise routine.   Enjoys some  usual interests and activities. Married 24 years. Has one step son. Friends and family in the area. Appetite adequate. Weight stable 138 - 65". Sleeps well most nights. Averages 8 hours. Focus and concentration difficulties. Completing tasks. Managing aspects of household. Works for [employer] - 40 hours - hybrid schedule. Denies SI or HI.  Denies AH or VH. Denies self harm. Denies substance use.  Previous medication trials: Lexapro, fluoxetine, bupropion, quetiapine (fatigue) Review of Systems:  Review of Systems  Musculoskeletal:  Negative for gait problem.  Neurological:  Negative for tremors.  Psychiatric/Behavioral:         Please refer to HPI    Medications: I have reviewed the patient's current medications.  Current Outpatient Medications  Medication Sig Dispense Refill   brexpiprazole (REXULTI) 1 MG TABS tablet Take 1 tablet (1 mg total) by mouth daily. 30 tablet 2   buPROPion (WELLBUTRIN XL) 300 MG 24 hr tablet Take 1 tablet (300 mg total) by mouth daily. 90 tablet 1   hydrOXYzine (ATARAX) 25 MG tablet Take 1 tablet (25 mg total) by mouth daily as needed. for anxiety 90 tablet 0   levothyroxine (SYNTHROID) 150 MCG tablet Take 150 mcg by mouth daily before breakfast.     metFORMIN (GLUCOPHAGE) 500 MG tablet Take 1 tablet by mouth 2 (two) times daily with a meal.     simvastatin (ZOCOR) 10 MG tablet Take 10 mg by mouth daily.     terbinafine (LAMISIL) 250 MG tablet Please take one a day x 7days, repeat every 4 weeks x 4 months 28 tablet 0   traZODone (DESYREL) 100 MG tablet Take 1  tablet (100 mg total) by mouth at bedtime as needed. 90 tablet 0   venlafaxine XR (EFFEXOR-XR) 75 MG 24 hr capsule Take 3 capsules (225 mg total) by mouth daily with breakfast. 270 capsule 1   No current facility-administered medications for this visit.    Medication Side Effects: None  Allergies:  Allergies  Allergen Reactions   Penicillins Rash    Past Medical History:  Diagnosis  Date   History of craniotomy    Hx of resection of meningioma    Hyperlipidemia    Prediabetes     Family History  Problem Relation Age of Onset   Alcohol abuse Mother    Breast cancer Maternal Grandmother    Alcohol abuse Maternal Grandfather     Social History   Socioeconomic History   Marital status: Married    Spouse name: Not on file   Number of children: Not on file   Years of education: Not on file   Highest education level: Not on file  Occupational History   Not on file  Tobacco Use   Smoking status: Never   Smokeless tobacco: Never  Substance and Sexual Activity   Alcohol use: Not on file   Drug use: Not on file   Sexual activity: Not on file  Other Topics Concern   Not on file  Social History Narrative   pt married, moved from [place] during pandemic, rented RV and travelled all over [place] for 1 year   Social Determinants of Health   Financial Resource Strain: Not on file  Food Insecurity: Not on file  Transportation Needs: Not on file  Physical Activity: Not on file  Stress: Not on file  Social Connections: Not on file  Intimate Partner Violence: Not on file    Past Medical History, Surgical history, Social history, and Family history were reviewed and updated as appropriate.   Please see review of systems for further details on the patient's review from today.   Objective:   Physical Exam:  There were no vitals taken for this visit.  Physical Exam Constitutional:      General: She is not in acute distress. Musculoskeletal:        General: No deformity.  Neurological:     Mental Status: She is alert and oriented to person, place, and time.     Coordination: Coordination normal.  Psychiatric:        Attention and Perception: Attention and perception normal. She does not perceive auditory or visual hallucinations.        Mood and Affect: Mood normal. Mood is not anxious or depressed. Affect is not labile, blunt, angry or inappropriate.         Speech: Speech normal.        Behavior: Behavior normal.        Thought Content: Thought content normal. Thought content is not paranoid or delusional. Thought content does not include homicidal or suicidal ideation. Thought content does not include homicidal or suicidal plan.        Cognition and Memory: Cognition and memory normal.        Judgment: Judgment normal.     Comments: Insight intact     Lab Review:     Component Value Date/Time   NA 138 05/07/2022 0848   K 4.2 05/07/2022 0848   CL 105 05/07/2022 0848   CO2 25 05/07/2022 0848   GLUCOSE 119 (H) 05/07/2022 0848   BUN 13 05/07/2022 0848   CREATININE 0.68 05/07/2022 0848  CALCIUM 8.9 05/07/2022 0848   PROT 7.2 05/07/2022 0848   ALBUMIN 4.3 05/07/2022 0848   AST 17 05/07/2022 0848   ALT 24 05/07/2022 0848   ALKPHOS 89 05/07/2022 0848   BILITOT 0.4 05/07/2022 0848    No results found for: "WBC", "RBC", "HGB", "HCT", "PLT", "MCV", "MCH", "MCHC", "RDW", "LYMPHSABS", "MONOABS", "EOSABS", "BASOSABS"  No results found for: "POCLITH", "LITHIUM"   No results found for: "PHENYTOIN", "PHENOBARB", "VALPROATE", "CBMZ"   .res Assessment: Plan:    Plan:  PDMP reviewed  Rexulti 0.5mg  daily - may increase to 1mg  tablet between appt's.  Buproprion XL300 mg daily (limited benefit from 450 mg) Venlafaxine 225 mg daily   Buspar 5 mg three times a day   Trazodone 100 mg at night as needed for sleep Hydroxyzine 25 mg daily as needed for anxiety   Patient advised to contact office with any questions, adverse effects, or acute worsening in signs and symptoms.  Discussed potential metabolic side effects associated with atypical antipsychotics, as well as potential risk for movement side effects. Advised pt to contact office if movement side effects occur.   RTC 4 weeks Diagnoses and all orders for this visit:  MDD (major depressive disorder), recurrent episode, mild (HCC) -     brexpiprazole (REXULTI) 1 MG TABS tablet; Take  1 tablet (1 mg total) by mouth daily.  GAD (generalized anxiety disorder)  Panic attack as reaction to stress  Insomnia, unspecified type     Please see After Visit Summary for patient specific instructions.  Future Appointments  Date Time Provider Department Center  06/11/2022  2:00 PM 13/01/2022, [facility] None  06/22/2022  1:00 PM [facility] N[facility]SE [facility]  06/25/2022  1:00 PM 06/27/2022, [facility] None  07/09/2022  2:00 PM 14/11/2021, [facility] None  07/23/2022  2:00 PM 07/25/2022, [facility] None  11/05/2022  8:00 AM 01/05/2023, NP [facility]    No orders of the defined types were placed in this encounter.     -------------------------------

## 2022-06-11 ENCOUNTER — Ambulatory Visit: Payer: 59 | Admitting: Mental Health

## 2022-06-11 ENCOUNTER — Other Ambulatory Visit: Payer: Self-pay | Admitting: Psychiatry

## 2022-06-20 ENCOUNTER — Ambulatory Visit (HOSPITAL_BASED_OUTPATIENT_CLINIC_OR_DEPARTMENT_OTHER): Payer: Self-pay | Admitting: Orthopaedic Surgery

## 2022-06-20 ENCOUNTER — Telehealth (HOSPITAL_BASED_OUTPATIENT_CLINIC_OR_DEPARTMENT_OTHER): Payer: Self-pay | Admitting: Orthopaedic Surgery

## 2022-06-20 NOTE — Telephone Encounter (Signed)
Appt rescheduling

## 2022-06-22 ENCOUNTER — Other Ambulatory Visit: Payer: 59

## 2022-06-22 ENCOUNTER — Encounter: Payer: Self-pay | Admitting: Family

## 2022-06-25 ENCOUNTER — Ambulatory Visit (INDEPENDENT_AMBULATORY_CARE_PROVIDER_SITE_OTHER): Payer: 59 | Admitting: Mental Health

## 2022-06-25 DIAGNOSIS — F411 Generalized anxiety disorder: Secondary | ICD-10-CM

## 2022-06-25 NOTE — Progress Notes (Signed)
[facility] note  Name: Kielee Care Date: 06/26/2022 MRN: [id number] DOB: September 24, 1960 PCP: Dulce Sellar, NP  Time spent:  54 minutes  Treatment:   ind. therapy    Mental Status Exam:    Appearance:    Casual     Behavior:   Appropriate  Motor:   WNL  Speech/Language:    Clear and Coherent  Affect:   Full range   Mood:   Anxious, pleasant  Thought process:   Logical, linear, goal directed  Thought content:     WNL  Sensory/Perceptual disturbances:     none  Orientation:   x4  Attention:   Good  Concentration:   Good  Memory:   Intact  Fund of knowledge:    Consistent with age and development  Insight:     Good  Judgment:    Good  Impulse Control:   Good     Reported Symptoms:   depressed mood, anxiety, rumination, sleep disturbance  Risk Assessment: Danger to Self:  No Self-injurious Behavior: No Danger to Others: No Duty to Warn:no Physical Aggression / Violence:No  Access to Firearms a concern: No  Gang Involvement:No  Patient / guardian was educated about steps to take if suicide or homicide risk level increases between visits: yes While future psychiatric events cannot be accurately predicted, the patient does not currently require acute inpatient psychiatric care and does not currently meet [facility] involuntary commitment criteria.   Medical History/Surgical History: Past Medical History:  Diagnosis Date   History of craniotomy    Hx of resection of meningioma    Hyperlipidemia    Prediabetes      Medications: Current Outpatient Medications  Medication Sig Dispense Refill   brexpiprazole (REXULTI) 1 MG TABS tablet Take 1 tablet (1 mg total) by mouth daily. 30 tablet 2   buPROPion (WELLBUTRIN XL) 300 MG 24 hr tablet Take 1 tablet (300 mg total) by mouth daily. 90 tablet 1   hydrOXYzine (ATARAX) 25 MG tablet Take 1 tablet (25 mg total) by mouth daily as needed. for anxiety 90 tablet 0   levothyroxine (SYNTHROID) 150  MCG tablet Take 150 mcg by mouth daily before breakfast.     metFORMIN (GLUCOPHAGE) 500 MG tablet Take 1 tablet by mouth 2 (two) times daily with a meal.     simvastatin (ZOCOR) 10 MG tablet Take 10 mg by mouth daily.     terbinafine (LAMISIL) 250 MG tablet Please take one a day x 7days, repeat every 4 weeks x 4 months 28 tablet 0   traZODone (DESYREL) 100 MG tablet Take 1 tablet (100 mg total) by mouth at bedtime as needed. 90 tablet 0   venlafaxine XR (EFFEXOR-XR) 75 MG 24 hr capsule Take 3 capsules (225 mg total) by mouth daily with breakfast. 270 capsule 1   No current facility-administered medications for this visit.    Allergies  Allergen Reactions   Penicillins Rash     Subjective: Patient arrived on time for today's session.  Assessed progress, events since last visit which was about 1 month ago.  She stated that she has decided to continue to work at her current job, going on the conditions have improved somewhat giving some details.  She focused primarily on the stressor of her marital relationship, specifically that her husband has less of a need of interpersonal independence than she does going on to give some examples.  Facilitated her identifying more specifically needs in this area where she plans to draw  some boundaries with how often she responds to him while she is at work, most often when she has to go to her work site which is 3 days/week.  Supportive reasons were identified and making this decision as well as additional ways to communicate these needs to her husband.  She identified that this in turn will reduce some of her anxiety and day-to-day stress.  Interventions: Further assessment, CBT, motivational interviewing   Diagnoses:    ICD-10-CM   1. GAD (generalized anxiety disorder)  F41.1          Plan:  Patient is to use CBT, mindfulness and coping skills to help manage / decrease symptoms associated with their diagnosis.   Long-term goals:   The patient  will report sustained reduction in symptoms of depression for at least 3 consecutive months.    Short-term goal:  The patient will learn and apply coping skills as identified in session.       2.   The patient will identify outlets for stress mgmt.       3.   Patient to identify and engage in effective communication in her marital relationship related to stressors as they arise.       4.   Patient to care for herself by getting adequate rest.   Waldron Session, [facility]  Children.  Elevated in the tachycardia good good to stagingIs [place] not severely feel little things more

## 2022-07-06 ENCOUNTER — Encounter: Payer: Self-pay | Admitting: Adult Health

## 2022-07-06 ENCOUNTER — Telehealth (INDEPENDENT_AMBULATORY_CARE_PROVIDER_SITE_OTHER): Payer: 59 | Admitting: Adult Health

## 2022-07-06 ENCOUNTER — Other Ambulatory Visit (INDEPENDENT_AMBULATORY_CARE_PROVIDER_SITE_OTHER): Payer: 59

## 2022-07-06 ENCOUNTER — Telehealth: Payer: Self-pay

## 2022-07-06 ENCOUNTER — Ambulatory Visit (INDEPENDENT_AMBULATORY_CARE_PROVIDER_SITE_OTHER): Payer: 59 | Admitting: Orthopaedic Surgery

## 2022-07-06 DIAGNOSIS — G4700 Insomnia, unspecified: Secondary | ICD-10-CM

## 2022-07-06 DIAGNOSIS — F411 Generalized anxiety disorder: Secondary | ICD-10-CM | POA: Diagnosis not present

## 2022-07-06 DIAGNOSIS — R7303 Prediabetes: Secondary | ICD-10-CM | POA: Diagnosis not present

## 2022-07-06 DIAGNOSIS — M65341 Trigger finger, right ring finger: Secondary | ICD-10-CM

## 2022-07-06 DIAGNOSIS — F410 Panic disorder [episodic paroxysmal anxiety] without agoraphobia: Secondary | ICD-10-CM

## 2022-07-06 DIAGNOSIS — F330 Major depressive disorder, recurrent, mild: Secondary | ICD-10-CM | POA: Diagnosis not present

## 2022-07-06 DIAGNOSIS — F430 Acute stress reaction: Secondary | ICD-10-CM

## 2022-07-06 LAB — HEMOGLOBIN A1C: Hgb A1c MFr Bld: 6.3 % (ref 4.6–6.5)

## 2022-07-06 NOTE — Telephone Encounter (Signed)
Lvm for pt in regards.  

## 2022-07-06 NOTE — Progress Notes (Signed)
Jill Pittman [id number] Aug 08, 1960 61 y.o.  Virtual Visit via Video Note  I connected with pt @ on 07/06/22 at  4:00 PM EST by a video enabled telemedicine application and verified that I am speaking with the correct person using two identifiers.   I discussed the limitations of evaluation and management by telemedicine and the availability of in person appointments. The patient expressed understanding and agreed to proceed.  I discussed the assessment and treatment plan with the patient. The patient was provided an opportunity to ask questions and all were answered. The patient agreed with the plan and demonstrated an understanding of the instructions.   The patient was advised to call back or seek an in-person evaluation if the symptoms worsen or if the condition fails to improve as anticipated.  I provided 25 minutes of non-face-to-face time during this encounter.  The patient was located at home.  The provider was located at [facility].   Dorothyann Gibbs, NP   Subjective:   Patient ID:  Jill Pittman is a 61 y.o. (DOB [birth year]) female.  Chief Complaint: No chief complaint on file.   HPI Jill Pittman presents for follow-up of MDD, GAD, insomnia and panic attacks.  Previously seen by Dr. Vanetta Shawl for medication management.   Describes mood today as "better". Pleasant. Reports some tearfulness. Mood symptoms - reports decreased depression. Reports decreased anxiety and irritability.Reports some worry, rumination, and over thinking. Reports a "possible" panic attack. Mood has improved with the increase in Rexulti. Seeing therapist - Elio Forget. Stable interest and motivation. Taking medications as prescribed.  Energy levels improved. Active, does not have a regular exercise routine.   Enjoys some usual interests and activities. Married 24 years. Has one step son. Friends and family in the area. Appetite adequate. Weight stable 139 - 65". Sleeps well most nights.  Averages 8 hours. Focus and concentration better. Completing tasks. Managing aspects of household. Works for [employer] - 40 hours - hybrid schedule. Denies SI or HI.  Denies AH or VH. Denies self harm. Denies substance use.  Previous medication trials: Lexapro, fluoxetine, bupropion, quetiapine (fatigue)   Review of Systems:  Review of Systems  Musculoskeletal:  Negative for gait problem.  Neurological:  Negative for tremors.  Psychiatric/Behavioral:         Please refer to HPI    Medications: I have reviewed the patient's current medications.  Current Outpatient Medications  Medication Sig Dispense Refill   brexpiprazole (REXULTI) 1 MG TABS tablet Take 1 tablet (1 mg total) by mouth daily. 30 tablet 2   buPROPion (WELLBUTRIN XL) 300 MG 24 hr tablet Take 1 tablet (300 mg total) by mouth daily. 90 tablet 1   hydrOXYzine (ATARAX) 25 MG tablet Take 1 tablet (25 mg total) by mouth daily as needed. for anxiety 90 tablet 0   levothyroxine (SYNTHROID) 150 MCG tablet Take 150 mcg by mouth daily before breakfast.     metFORMIN (GLUCOPHAGE) 500 MG tablet Take 1 tablet by mouth 2 (two) times daily with a meal.     simvastatin (ZOCOR) 10 MG tablet Take 10 mg by mouth daily.     terbinafine (LAMISIL) 250 MG tablet Please take one a day x 7days, repeat every 4 weeks x 4 months 28 tablet 0   traZODone (DESYREL) 100 MG tablet Take 1 tablet (100 mg total) by mouth at bedtime as needed. 90 tablet 0   venlafaxine XR (EFFEXOR-XR) 75 MG 24 hr capsule Take 3 capsules (225 mg total) by mouth  daily with breakfast. 270 capsule 1   No current facility-administered medications for this visit.    Medication Side Effects: None  Allergies:  Allergies  Allergen Reactions   Penicillins Rash    Past Medical History:  Diagnosis Date   History of craniotomy    Hx of resection of meningioma    Hyperlipidemia    Prediabetes     Family History  Problem Relation Age of Onset   Alcohol abuse Mother     Breast cancer Maternal Grandmother    Alcohol abuse Maternal Grandfather     Social History   Socioeconomic History   Marital status: Married    Spouse name: Not on file   Number of children: Not on file   Years of education: Not on file   Highest education level: Not on file  Occupational History   Not on file  Tobacco Use   Smoking status: Never   Smokeless tobacco: Never  Substance and Sexual Activity   Alcohol use: Not on file   Drug use: Not on file   Sexual activity: Not on file  Other Topics Concern   Not on file  Social History Narrative   pt married, moved from [place] during pandemic, rented RV and travelled all over [place] for 1 year   Social Determinants of Health   Financial Resource Strain: Not on file  Food Insecurity: Not on file  Transportation Needs: Not on file  Physical Activity: Not on file  Stress: Not on file  Social Connections: Not on file  Intimate Partner Violence: Not on file    Past Medical History, Surgical history, Social history, and Family history were reviewed and updated as appropriate.   Please see review of systems for further details on the patient's review from today.   Objective:   Physical Exam:  There were no vitals taken for this visit.  Physical Exam Constitutional:      General: She is not in acute distress. Musculoskeletal:        General: No deformity.  Neurological:     Mental Status: She is alert and oriented to person, place, and time.     Coordination: Coordination normal.  Psychiatric:        Attention and Perception: Attention and perception normal. She does not perceive auditory or visual hallucinations.        Mood and Affect: Mood normal. Mood is not anxious or depressed. Affect is not labile, blunt, angry or inappropriate.        Speech: Speech normal.        Behavior: Behavior normal.        Thought Content: Thought content normal. Thought content is not paranoid or delusional. Thought content does not  include homicidal or suicidal ideation. Thought content does not include homicidal or suicidal plan.        Cognition and Memory: Cognition and memory normal.        Judgment: Judgment normal.     Comments: Insight intact     Lab Review:     Component Value Date/Time   NA 138 05/07/2022 0848   K 4.2 05/07/2022 0848   CL 105 05/07/2022 0848   CO2 25 05/07/2022 0848   GLUCOSE 119 (H) 05/07/2022 0848   BUN 13 05/07/2022 0848   CREATININE 0.68 05/07/2022 0848   CALCIUM 8.9 05/07/2022 0848   PROT 7.2 05/07/2022 0848   ALBUMIN 4.3 05/07/2022 0848   AST 17 05/07/2022 0848   ALT 24 05/07/2022 0848  ALKPHOS 89 05/07/2022 0848   BILITOT 0.4 05/07/2022 0848    No results found for: "WBC", "RBC", "HGB", "HCT", "PLT", "MCV", "MCH", "MCHC", "RDW", "LYMPHSABS", "MONOABS", "EOSABS", "BASOSABS"  No results found for: "POCLITH", "LITHIUM"   No results found for: "PHENYTOIN", "PHENOBARB", "VALPROATE", "CBMZ"   .res Assessment: Plan:    Plan:  PDMP reviewed  Rexulti 1mg  tablet daily  Buproprion XL300 mg daily (limited benefit from 450 mg) Venlafaxine 225 mg daily   Trazodone 100 mg at night as needed for sleep Hydroxyzine 25 mg daily as needed for anxiety  D/C Buspar 5 mg three times a day    Patient advised to contact office with any questions, adverse effects, or acute worsening in signs and symptoms.  Discussed potential metabolic side effects associated with atypical antipsychotics, as well as potential risk for movement side effects. Advised pt to contact office if movement side effects occur.   RTC 4 weeks There are no diagnoses linked to this encounter.   Please see After Visit Summary for patient specific instructions.  Future Appointments  Date Time Provider Department Center  07/09/2022  2:00 PM 14/11/2021, [facility] None  07/23/2022  2:00 PM 07/25/2022, [facility] None  11/05/2022  8:00 AM 01/05/2023, NP [facility]    No orders of  the defined types were placed in this encounter.     -------------------------------

## 2022-07-06 NOTE — Progress Notes (Signed)
Your A1C is slightly higher at 6.3.  Keep taking the Metformin twice a day. Work on reducing the carbs in your diet, especially the simple carbs: white rice, white or sweet potatoes, white bread, crackers, snacks, sweets, etc.  Fruit should only be 2 small servings daily.  Any questions. let me know!

## 2022-07-06 NOTE — Progress Notes (Signed)
Chief Complaint: Right ring finger trigger finger     History of Present Illness:    Jill Pittman is a 61 y.o. female right dominant female presents with right ring finger nodule over the A1 pulley that has been bothersome over the last several weeks.  She does have a distant history of feeling like the finger gets stuck although this has not happened recently.  This is her dominant hand.  She she does have a small cyst over the dorsal aspect of the proximal IP joint as well but this is not as bothersome.    Surgical History:   none  PMH/PSH/Family History/Social History/Meds/Allergies:    Past Medical History:  Diagnosis Date  . History of craniotomy   . Hx of resection of meningioma   . Hyperlipidemia   . Prediabetes    No past surgical history on file. Social History   Socioeconomic History  . Marital status: Married    Spouse name: Not on file  . Number of children: Not on file  . Years of education: Not on file  . Highest education level: Not on file  Occupational History  . Not on file  Tobacco Use  . Smoking status: Never  . Smokeless tobacco: Never  Substance and Sexual Activity  . Alcohol use: Not on file  . Drug use: Not on file  . Sexual activity: Not on file  Other Topics Concern  . Not on file  Social History Narrative   pt married, moved from [place] during pandemic, rented RV and travelled all over [place] for 1 year   Social Determinants of Health   Financial Resource Strain: Not on file  Food Insecurity: Not on file  Transportation Needs: Not on file  Physical Activity: Not on file  Stress: Not on file  Social Connections: Not on file   Family History  Problem Relation Age of Onset  . Alcohol abuse Mother   . Breast cancer Maternal Grandmother   . Alcohol abuse Maternal Grandfather    Allergies  Allergen Reactions  . Penicillins Rash   Current Outpatient Medications  Medication Sig Dispense Refill  .  brexpiprazole (REXULTI) 1 MG TABS tablet Take 1 tablet (1 mg total) by mouth daily. 30 tablet 2  . buPROPion (WELLBUTRIN XL) 300 MG 24 hr tablet Take 1 tablet (300 mg total) by mouth daily. 90 tablet 1  . hydrOXYzine (ATARAX) 25 MG tablet Take 1 tablet (25 mg total) by mouth daily as needed. for anxiety 90 tablet 0  . levothyroxine (SYNTHROID) 150 MCG tablet Take 150 mcg by mouth daily before breakfast.    . metFORMIN (GLUCOPHAGE) 500 MG tablet Take 1 tablet by mouth 2 (two) times daily with a meal.    . simvastatin (ZOCOR) 10 MG tablet Take 10 mg by mouth daily.    Marland Kitchen terbinafine (LAMISIL) 250 MG tablet Please take one a day x 7days, repeat every 4 weeks x 4 months 28 tablet 0  . traZODone (DESYREL) 100 MG tablet Take 1 tablet (100 mg total) by mouth at bedtime as needed. 90 tablet 0  . venlafaxine XR (EFFEXOR-XR) 75 MG 24 hr capsule Take 3 capsules (225 mg total) by mouth daily with breakfast. 270 capsule 1   No current facility-administered medications for this visit.   No results found.  Review  of Systems:   A ROS was performed including pertinent positives and negatives as documented in the HPI.  Physical Exam :   Constitutional: NAD and appears stated age Neurological: Alert and oriented Psych: Appropriate affect and cooperative There were no vitals taken for this visit.   Comprehensive Musculoskeletal Exam:    Tenderness palpation about the A1 pulley of right ring finger.  No ratcheting or triggering on flexion.  Full composite fist.  Imaging:    I personally reviewed and interpreted the radiographs.   Assessment:   61 y.o. female with a right ring finger trigger finger very early.  To that effect I described that I would initially recommend an A1 pulley ultrasound-guided injection.  She would like to proceed with this today.  I will plan to see her back as needed  Plan :    -Right ring finger injection performed informed consent obtained    Procedure Note  Patient:  Jill Pittman             Date of Birth: [birth year]           MRN: [id number]             Visit Date: 07/06/2022  Procedures: Visit Diagnoses: No diagnosis found.  Small Joint Inj: R ring MCP on 07/06/2022 1:38 PM         I personally saw and evaluated the patient, and participated in the management and treatment plan.  Huel Cote, MD Attending Physician, Orthopedic Surgery  This document was dictated using Dragon voice recognition software. A reasonable attempt at proof reading has been made to minimize errors.

## 2022-07-06 NOTE — Telephone Encounter (Signed)
-----   Message from Dulce Sellar, NP sent at 07/05/2022 11:22 PM EST ----- Regarding: Bone density test So from your message after talking to [facility], there is nothing I need to do? They are going to rerun the bill under preventative and it may take 30-45 days?  Please let Ronni know, thx.

## 2022-07-09 ENCOUNTER — Other Ambulatory Visit: Payer: Self-pay

## 2022-07-09 ENCOUNTER — Encounter: Payer: Self-pay | Admitting: Family

## 2022-07-09 ENCOUNTER — Ambulatory Visit: Payer: 59 | Admitting: Mental Health

## 2022-07-09 ENCOUNTER — Telehealth: Payer: Self-pay | Admitting: Adult Health

## 2022-07-09 DIAGNOSIS — G4700 Insomnia, unspecified: Secondary | ICD-10-CM

## 2022-07-09 DIAGNOSIS — F411 Generalized anxiety disorder: Secondary | ICD-10-CM

## 2022-07-09 DIAGNOSIS — F410 Panic disorder [episodic paroxysmal anxiety] without agoraphobia: Secondary | ICD-10-CM

## 2022-07-09 MED ORDER — TRAZODONE HCL 100 MG PO TABS: 100.0000 mg | ORAL_TABLET | Freq: Every evening | ORAL | 0 refills | Status: DC | PRN

## 2022-07-09 MED ORDER — HYDROXYZINE HCL 25 MG PO TABS: 25.0000 mg | ORAL_TABLET | Freq: Every day | ORAL | 0 refills | Status: DC | PRN

## 2022-07-09 NOTE — Telephone Encounter (Signed)
Pt called for refill of Buproprion, Hydroxyzine, Trazadone and Venlafaxine to [facility] for 90 day refills.  I advised her the only one that looks like it's ready for a refill is Venlafaxine.  Will you send that one in?  Can you set the others up to be refill to [facility] when they become due or does she need to call back when it's time and ask for them to be sent to [facility]?

## 2022-07-09 NOTE — Telephone Encounter (Signed)
Sent refills that are due

## 2022-07-10 ENCOUNTER — Other Ambulatory Visit: Payer: Self-pay

## 2022-07-10 DIAGNOSIS — R7303 Prediabetes: Secondary | ICD-10-CM

## 2022-07-10 DIAGNOSIS — E039 Hypothyroidism, unspecified: Secondary | ICD-10-CM

## 2022-07-10 MED ORDER — METFORMIN HCL 500 MG PO TABS: 500.0000 mg | ORAL_TABLET | Freq: Two times a day (BID) | ORAL | 1 refills | Status: DC

## 2022-07-10 MED ORDER — LEVOTHYROXINE SODIUM 150 MCG PO TABS: 150.0000 ug | ORAL_TABLET | Freq: Every day | ORAL | 1 refills | Status: DC

## 2022-07-23 ENCOUNTER — Ambulatory Visit (INDEPENDENT_AMBULATORY_CARE_PROVIDER_SITE_OTHER): Payer: 59 | Admitting: Mental Health

## 2022-07-23 DIAGNOSIS — F330 Major depressive disorder, recurrent, mild: Secondary | ICD-10-CM | POA: Diagnosis not present

## 2022-07-23 NOTE — Progress Notes (Signed)
[facility] Psych[facility] note  Name: Tytianna Greenley Date: 07/23/2022 MRN: [id number] DOB: 1961/05/24 PCP: Dulce Sellar, NP  Time spent:  50 minutes  Treatment:   ind. therapy    Mental Status Exam:    Appearance:    Casual     Behavior:   Appropriate  Motor:   WNL  Speech/Language:    Clear and Coherent  Affect:   Full range   Mood:   Anxious, pleasant  Thought process:   Logical, linear, goal directed  Thought content:     WNL  Sensory/Perceptual disturbances:     none  Orientation:   x4  Attention:   Good  Concentration:   Good  Memory:   Intact  Fund of knowledge:    Consistent with age and development  Insight:     Good  Judgment:    Good  Impulse Control:   Good     Reported Symptoms:   depressed mood, anxiety, rumination, sleep disturbance  Risk Assessment: Danger to Self:  No Self-injurious Behavior: No Danger to Others: No Duty to Warn:no Physical Aggression / Violence:No  Access to Firearms a concern: No  Gang Involvement:No  Patient / guardian was educated about steps to take if suicide or homicide risk level increases between visits: yes While future psychiatric events cannot be accurately predicted, the patient does not currently require acute inpatient psychiatric care and does not currently meet [facility] involuntary commitment criteria.   Medical History/Surgical History: Past Medical History:  Diagnosis Date   History of craniotomy    Hx of resection of meningioma    Hyperlipidemia    Prediabetes      Medications: Current Outpatient Medications  Medication Sig Dispense Refill   brexpiprazole (REXULTI) 1 MG TABS tablet Take 1 tablet (1 mg total) by mouth daily. 30 tablet 2   buPROPion (WELLBUTRIN XL) 300 MG 24 hr tablet Take 1 tablet (300 mg total) by mouth daily. 90 tablet 1   hydrOXYzine (ATARAX) 25 MG tablet Take 1 tablet (25 mg total) by mouth daily as needed. for anxiety 90 tablet 0   levothyroxine (SYNTHROID) 150  MCG tablet Take 1 tablet (150 mcg total) by mouth daily before breakfast. 90 tablet 1   metFORMIN (GLUCOPHAGE) 500 MG tablet Take 1 tablet (500 mg total) by mouth 2 (two) times daily with a meal. 90 tablet 1   simvastatin (ZOCOR) 10 MG tablet Take 10 mg by mouth daily.     terbinafine (LAMISIL) 250 MG tablet Please take one a day x 7days, repeat every 4 weeks x 4 months 28 tablet 0   traZODone (DESYREL) 100 MG tablet Take 1 tablet (100 mg total) by mouth at bedtime as needed. 90 tablet 0   venlafaxine XR (EFFEXOR-XR) 75 MG 24 hr capsule Take 3 capsules (225 mg total) by mouth daily with breakfast. 270 capsule 1   No current facility-administered medications for this visit.    Allergies  Allergen Reactions   Penicillins Rash     Subjective: Patient arrived on time for today's session.  Patient shared progress since last visit which was about 1 month ago.  She stated she continues to work her current job, some dissatisfaction as she feels she is being treated unfairly.  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  Patient plans to facilitate their being a meeting so they can further discuss as a group.  She stated she also plans to continue her job search process into the new year.  She stated her husband has continued to work his part-time job and has also continued to look for other employment options.  She expressed feelings of disappointment and some sadness due to her brother continuing to not have contact with her, although she sent a text recently with which he did respond but then subsequently did not with follow-up text messages.  She is uncertain of reasons why they have had a strained relationship which has been prevalent for the past 2 to 3 years.   Interventions:   CBT, motivational interviewing   Diagnoses:  No diagnosis found.      Plan:  Patient  is to use CBT, mindfulness and coping skills to help manage / decrease symptoms associated with their diagnosis.   Long-term goals:   The patient will report sustained reduction in symptoms of depression for at least 3 consecutive months.    Short-term goal:  The patient will learn and apply coping skills as identified in session.       2.   The patient will identify outlets for stress mgmt.       3.   Patient to identify and engage in effective communication in her marital relationship related to stressors as they arise.       4.   Patient to care for herself by getting adequate rest.   Anson [place], [facility]

## 2022-08-20 ENCOUNTER — Ambulatory Visit (INDEPENDENT_AMBULATORY_CARE_PROVIDER_SITE_OTHER): Payer: 59 | Admitting: Adult Health

## 2022-08-20 ENCOUNTER — Encounter: Payer: Self-pay | Admitting: Adult Health

## 2022-08-20 DIAGNOSIS — G4700 Insomnia, unspecified: Secondary | ICD-10-CM

## 2022-08-20 DIAGNOSIS — F411 Generalized anxiety disorder: Secondary | ICD-10-CM

## 2022-08-20 DIAGNOSIS — F430 Acute stress reaction: Secondary | ICD-10-CM

## 2022-08-20 DIAGNOSIS — F330 Major depressive disorder, recurrent, mild: Secondary | ICD-10-CM | POA: Diagnosis not present

## 2022-08-20 DIAGNOSIS — F410 Panic disorder [episodic paroxysmal anxiety] without agoraphobia: Secondary | ICD-10-CM | POA: Diagnosis not present

## 2022-08-20 MED ORDER — HYDROXYZINE HCL 25 MG PO TABS: 25.0000 mg | ORAL_TABLET | Freq: Every day | ORAL | 3 refills | Status: DC | PRN

## 2022-08-20 MED ORDER — BUPROPION HCL ER (XL) 300 MG PO TB24: 300.0000 mg | ORAL_TABLET | Freq: Every day | ORAL | 3 refills | Status: DC

## 2022-08-20 MED ORDER — TRAZODONE HCL 100 MG PO TABS: 100.0000 mg | ORAL_TABLET | Freq: Every evening | ORAL | 3 refills | Status: DC | PRN

## 2022-08-20 MED ORDER — VENLAFAXINE HCL ER 75 MG PO CP24: 225.0000 mg | ORAL_CAPSULE | Freq: Every day | ORAL | 3 refills | Status: DC

## 2022-08-20 MED ORDER — BREXPIPRAZOLE 1 MG PO TABS: 1.0000 mg | ORAL_TABLET | Freq: Every day | ORAL | 3 refills | Status: DC

## 2022-08-20 NOTE — Progress Notes (Signed)
Caralina Nop [id number] 02/25/1961 62 y.o.  Subjective:   Patient ID:  Jill Pittman is a 62 y.o. (DOB 15-May-1961) female.  Chief Complaint: No chief complaint on file.   HPI Jill Pittman presents to the office today for follow-up of MDD, GAD, insomnia and panic attacks.  Previously seen by Dr. Modesta Messing for medication management.   Describes mood today as "better". Pleasant. Reports some tearfulness. Mood symptoms - reports some  depression over the past few weeks. Reports decreased anxiety and irritability. Reports worry, rumination, and over thinking. Denies recent panic attacks. Mood is consistent. Feels like medications are helpful. Seeing therapist - Lanetta Inch. Stable interest and motivation. Taking medications as prescribed.  Energy levels "ok". Active, does not have a regular exercise routine.   Enjoys some usual interests and activities. Married. Lives with husband. Has one step son. Friends and family in the area. Appetite adequate. Weight stable 139 - 65". Sleeps well most nights. Averages 8 hours. Focus and concentration better. Completing tasks. Managing aspects of household. Works for [employer] - 40 hours - hybrid schedule. Denies SI or HI.  Denies AH or VH. Denies self harm. Denies substance use.  Previous medication trials: Lexapro, fluoxetine, bupropion, quetiapine (fatigue)   PHQ2-9    [place] Office Visit from 05/07/2022 in [place] Visit from 11/06/2021 in [place]  PHQ-2 Total Score 0 0  PHQ-9 Total Score 0 --        Review of Systems:  Review of Systems  Musculoskeletal:  Negative for gait problem.  Neurological:  Negative for tremors.  Psychiatric/Behavioral:         Please refer to HPI    Medications: I have reviewed the patient's current medications.  Current Outpatient Medications  Medication Sig Dispense Refill   brexpiprazole (REXULTI) 1 MG TABS tablet Take 1 tablet  (1 mg total) by mouth daily. 90 tablet 3   buPROPion (WELLBUTRIN XL) 300 MG 24 hr tablet Take 1 tablet (300 mg total) by mouth daily. 90 tablet 3   hydrOXYzine (ATARAX) 25 MG tablet Take 1 tablet (25 mg total) by mouth daily as needed. for anxiety 90 tablet 3   levothyroxine (SYNTHROID) 150 MCG tablet Take 1 tablet (150 mcg total) by mouth daily before breakfast. 90 tablet 1   metFORMIN (GLUCOPHAGE) 500 MG tablet Take 1 tablet (500 mg total) by mouth 2 (two) times daily with a meal. 90 tablet 1   simvastatin (ZOCOR) 10 MG tablet Take 10 mg by mouth daily.     terbinafine (LAMISIL) 250 MG tablet Please take one a day x 7days, repeat every 4 weeks x 4 months 28 tablet 0   traZODone (DESYREL) 100 MG tablet Take 1 tablet (100 mg total) by mouth at bedtime as needed. 90 tablet 3   venlafaxine XR (EFFEXOR-XR) 75 MG 24 hr capsule Take 3 capsules (225 mg total) by mouth daily with breakfast. 270 capsule 3   No current facility-administered medications for this visit.    Medication Side Effects: None  Allergies:  Allergies  Allergen Reactions   Penicillins Rash    Past Medical History:  Diagnosis Date   History of craniotomy    Hx of resection of meningioma    Hyperlipidemia    Prediabetes     Past Medical History, Surgical history, Social history, and Family history were reviewed and updated as appropriate.   Please see review of systems for further details on the patient's review from today.  Objective:   Physical Exam:  There were no vitals taken for this visit.  Physical Exam Constitutional:      General: She is not in acute distress. Musculoskeletal:        General: No deformity.  Neurological:     Mental Status: She is alert and oriented to person, place, and time.     Coordination: Coordination normal.  Psychiatric:        Attention and Perception: Attention and perception normal. She does not perceive auditory or visual hallucinations.        Mood and Affect: Mood  normal. Mood is not anxious or depressed. Affect is not labile, blunt, angry or inappropriate.        Speech: Speech normal.        Behavior: Behavior normal.        Thought Content: Thought content normal. Thought content is not paranoid or delusional. Thought content does not include homicidal or suicidal ideation. Thought content does not include homicidal or suicidal plan.        Cognition and Memory: Cognition and memory normal.        Judgment: Judgment normal.     Comments: Insight intact     Lab Review:     Component Value Date/Time   NA 138 05/07/2022 0848   K 4.2 05/07/2022 0848   CL 105 05/07/2022 0848   CO2 25 05/07/2022 0848   GLUCOSE 119 (H) 05/07/2022 0848   BUN 13 05/07/2022 0848   CREATININE 0.68 05/07/2022 0848   CALCIUM 8.9 05/07/2022 0848   PROT 7.2 05/07/2022 0848   ALBUMIN 4.3 05/07/2022 0848   AST 17 05/07/2022 0848   ALT 24 05/07/2022 0848   ALKPHOS 89 05/07/2022 0848   BILITOT 0.4 05/07/2022 0848    No results found for: "WBC", "RBC", "HGB", "HCT", "PLT", "MCV", "MCH", "MCHC", "RDW", "LYMPHSABS", "MONOABS", "EOSABS", "BASOSABS"  No results found for: "POCLITH", "LITHIUM"   No results found for: "PHENYTOIN", "PHENOBARB", "VALPROATE", "CBMZ"   .res Assessment: Plan:    Plan:  PDMP reviewed  Rexulti 1mg  tablet daily  Buproprion XL300 mg daily  Venlafaxine 225 mg daily   Trazodone 100 mg at night as needed for sleep Hydroxyzine 25 mg daily as needed for anxiety   Patient advised to contact office with any questions, adverse effects, or acute worsening in signs and symptoms.  Discussed potential metabolic side effects associated with atypical antipsychotics, as well as potential risk for movement side effects. Advised pt to contact office if movement side effects occur.   RTC 8 weeks  Diagnoses and all orders for this visit:  Insomnia, unspecified type -     traZODone (DESYREL) 100 MG tablet; Take 1 tablet (100 mg total) by mouth at  bedtime as needed.  GAD (generalized anxiety disorder) -     hydrOXYzine (ATARAX) 25 MG tablet; Take 1 tablet (25 mg total) by mouth daily as needed. for anxiety -     buPROPion (WELLBUTRIN XL) 300 MG 24 hr tablet; Take 1 tablet (300 mg total) by mouth daily.  Panic attack as reaction to stress -     hydrOXYzine (ATARAX) 25 MG tablet; Take 1 tablet (25 mg total) by mouth daily as needed. for anxiety  MDD (major depressive disorder), recurrent episode, mild (H[facility]) -     buPROPion (WELLBUTRIN XL) 300 MG 24 hr tablet; Take 1 tablet (300 mg total) by mouth daily. -     brexpiprazole (REXULTI) 1 MG TABS tablet; Take 1 tablet (1  mg total) by mouth daily.  Other orders -     venlafaxine XR (EFFEXOR-XR) 75 MG 24 hr capsule; Take 3 capsules (225 mg total) by mouth daily with breakfast.     Please see After Visit Summary for patient specific instructions.  Future Appointments  Date Time Provider [place]  08/27/2022  1:00 PM Anson [place], [facility] CP-CP None  09/17/2022 10:00 AM Anson [place], [facility] CP-CP None  10/08/2022  1:00 PM Anson [place], [facility] CP-CP None  11/05/2022  8:00 AM Jeanie Sewer, NP [facility] P[facility]    No orders of the defined types were placed in this encounter.   -------------------------------

## 2022-08-27 ENCOUNTER — Ambulatory Visit (INDEPENDENT_AMBULATORY_CARE_PROVIDER_SITE_OTHER): Payer: 59 | Admitting: Mental Health

## 2022-08-27 DIAGNOSIS — F411 Generalized anxiety disorder: Secondary | ICD-10-CM

## 2022-08-27 DIAGNOSIS — F330 Major depressive disorder, recurrent, mild: Secondary | ICD-10-CM

## 2022-08-27 NOTE — Progress Notes (Addendum)
[facility] Psych[facility] note  Name: Jill Pittman Date: 08/27/22 MRN: [id number] DOB: 1960-10-25 PCP: Jeanie Sewer, NP  Time spent:  50 minutes  Treatment:   ind. therapy   Mental Status Exam:    Appearance:    Casual     Behavior:   Appropriate  Motor:   WNL  Speech/Language:    Clear and Coherent  Affect:   Full range   Mood:   Anxious, pleasant  Thought process:   Logical, linear, goal directed  Thought content:     WNL  Sensory/Perceptual disturbances:     none  Orientation:   x4  Attention:   Good  Concentration:   Good  Memory:   Intact  Fund of knowledge:    Consistent with age and development  Insight:     Good  Judgment:    Good  Impulse Control:   Good     Reported Symptoms:   depressed mood, anxiety, rumination, sleep disturbance  Risk Assessment: Danger to Self:  No Self-injurious Behavior: No Danger to Others: No Duty to Warn:no Physical Aggression / Violence:No  Access to Firearms a concern: No  Gang Involvement:No  Patient / guardian was educated about steps to take if suicide or homicide risk level increases between visits: yes While future psychiatric events cannot be accurately predicted, the patient does not currently require acute inpatient psychiatric care and does not currently meet [facility] involuntary commitment criteria.   Medications: Current Outpatient Medications  Medication Sig Dispense Refill   brexpiprazole (REXULTI) 1 MG TABS tablet Take 1 tablet (1 mg total) by mouth daily. 90 tablet 3   buPROPion (WELLBUTRIN XL) 300 MG 24 hr tablet Take 1 tablet (300 mg total) by mouth daily. 90 tablet 3   hydrOXYzine (ATARAX) 25 MG tablet Take 1 tablet (25 mg total) by mouth daily as needed. for anxiety 90 tablet 3   levothyroxine (SYNTHROID) 150 MCG tablet Take 1 tablet (150 mcg total) by mouth daily before breakfast. 90 tablet 1   metFORMIN (GLUCOPHAGE) 500 MG tablet Take 1 tablet (500 mg total) by mouth 2 (two) times  daily with a meal. 90 tablet 1   simvastatin (ZOCOR) 10 MG tablet Take 10 mg by mouth daily.     terbinafine (LAMISIL) 250 MG tablet Please take one a day x 7days, repeat every 4 weeks x 4 months 28 tablet 0   traZODone (DESYREL) 100 MG tablet Take 1 tablet (100 mg total) by mouth at bedtime as needed. 90 tablet 3   venlafaxine XR (EFFEXOR-XR) 75 MG 24 hr capsule Take 3 capsules (225 mg total) by mouth daily with breakfast. 270 capsule 3   No current facility-administered medications for this visit.    Allergies  Allergen Reactions   Penicillins Rash     Subjective: Patient arrived on time for today's session.  Assessed progress.  She stated that she has had some increased stress due to her husband losing his job recently, this a result of changes at the company he was employed, but adding to patient's anxiety due to their financial stress.  She shared positive news where they learned they were going to be grandparents over the Christmas holiday, excited that their son and his wife shared this news.  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  She reports having some less stress with her current supervisor however, she stated that she has not received a pay raise last year and needs increased income.  She stated  she has a performance review approaching and expects to have some increased compensation.  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  Also, provided support and understanding as she processed some feelings of distress, frustration related to challenges with her husband having consistent employment over the years.   Interventions:    motivational interviewing, supportive therapy   Diagnoses:    ICD-10-CM   1. GAD (generalized anxiety disorder)  F41.1     2. MDD (major depressive disorder), recurrent  episode, mild ([place])  F33.0           Plan:  Patient is to use CBT, mindfulness and coping skills to help manage / decrease symptoms associated with their diagnosis.  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  Long-term goals:   The patient will report sustained reduction in symptoms of depression for at least 3 consecutive months.    Short-term goal:  The patient will learn and apply coping skills as identified in session.       2.   The patient will identify outlets for stress mgmt.       3.   Patient to identify and engage in effective communication in her marital relationship related to stressors as they arise.       4.   Patient to care for herself by getting adequate rest.   Anson [place], [facility]

## 2022-08-31 ENCOUNTER — Other Ambulatory Visit: Payer: Self-pay

## 2022-08-31 ENCOUNTER — Encounter: Payer: Self-pay | Admitting: Family

## 2022-08-31 ENCOUNTER — Other Ambulatory Visit: Payer: Self-pay | Admitting: Family

## 2022-08-31 MED ORDER — SIMVASTATIN 10 MG PO TABS: 10.0000 mg | ORAL_TABLET | Freq: Every day | ORAL | 1 refills | Status: DC

## 2022-08-31 NOTE — Telephone Encounter (Signed)
yes, thx

## 2022-09-17 ENCOUNTER — Ambulatory Visit (INDEPENDENT_AMBULATORY_CARE_PROVIDER_SITE_OTHER): Payer: 59 | Admitting: Mental Health

## 2022-09-17 DIAGNOSIS — F411 Generalized anxiety disorder: Secondary | ICD-10-CM | POA: Diagnosis not present

## 2022-09-17 DIAGNOSIS — F330 Major depressive disorder, recurrent, mild: Secondary | ICD-10-CM

## 2022-09-17 NOTE — Progress Notes (Signed)
[facility] note  Name: Jill Pittman Date: 09/17/22 MRN: [id number] DOB: 1960/12/15 PCP: Jeanie Sewer, NP  Time spent:  51 minutes  Treatment:   ind. therapy   Mental Status Exam:    Appearance:    Casual     Behavior:   Appropriate  Motor:   WNL  Speech/Language:    Clear and Coherent  Affect:   Full range   Mood:   Anxious, pleasant  Thought process:   Logical, linear, goal directed  Thought content:     WNL  Sensory/Perceptual disturbances:     none  Orientation:   x4  Attention:   Good  Concentration:   Good  Memory:   Intact  Fund of knowledge:    Consistent with age and development  Insight:     Good  Judgment:    Good  Impulse Control:   Good     Reported Symptoms:   depressed mood, anxiety, rumination, sleep disturbance  Risk Assessment: Danger to Self:  No Self-injurious Behavior: No Danger to Others: No Duty to Warn:no Physical Aggression / Violence:No  Access to Firearms a concern: No  Gang Involvement:No  Patient / guardian was educated about steps to take if suicide or homicide risk level increases between visits: yes While future psychiatric events cannot be accurately predicted, the patient does not currently require acute inpatient psychiatric care and does not currently meet [facility] involuntary commitment criteria.   Medications: Current Outpatient Medications  Medication Sig Dispense Refill   brexpiprazole (REXULTI) 1 MG TABS tablet Take 1 tablet (1 mg total) by mouth daily. 90 tablet 3   buPROPion (WELLBUTRIN XL) 300 MG 24 hr tablet Take 1 tablet (300 mg total) by mouth daily. 90 tablet 3   hydrOXYzine (ATARAX) 25 MG tablet Take 1 tablet (25 mg total) by mouth daily as needed. for anxiety 90 tablet 3   levothyroxine (SYNTHROID) 150 MCG tablet Take 1 tablet (150 mcg total) by mouth daily before breakfast. 90 tablet 1   metFORMIN (GLUCOPHAGE) 500 MG tablet Take 1 tablet (500 mg total) by mouth 2 (two) times  daily with a meal. 90 tablet 1   simvastatin (ZOCOR) 10 MG tablet Take 1 tablet (10 mg total) by mouth daily. 90 tablet 1   terbinafine (LAMISIL) 250 MG tablet Please take one a day x 7days, repeat every 4 weeks x 4 months 28 tablet 0   traZODone (DESYREL) 100 MG tablet Take 1 tablet (100 mg total) by mouth at bedtime as needed. 90 tablet 3   venlafaxine XR (EFFEXOR-XR) 75 MG 24 hr capsule Take 3 capsules (225 mg total) by mouth daily with breakfast. 270 capsule 3   No current facility-administered medications for this visit.    Allergies  Allergen Reactions   Penicillins Rash     Subjective: Patient arrived on time for today's session.  Assessed progress.  Patient shared recent events where she provided support to her husband as he had a recent surgery.  She stated the initial diagnosis was related to cancer however, she stated that upon further assessment he is cancer free.  She stated that she had difficulty communicating with her supervisor to work from home the day after surgery when requested.  Patient went on to share the considerable challenges she has with his supervisor, along with other coworkers in her department.  She stated that she was able to speak to the director and after further consideration from the company, she was granted the day off.  She went on to share ongoing stress related to finances, how she wants her husband to look more aggressively for a job and she stated he is continued to make some steps in this area.  She identified the need to also continue her job search to lower her day-to-day stress given her current job work experiences.  Facilitated throughout session patient identifying needs, worked with her to reframe thoughts associated with recent stressors and explored ways to cope and care for herself.   Interventions:    motivational interviewing, supportive therapy   Diagnoses:    ICD-10-CM   1. GAD (generalized anxiety disorder)  F41.1     2. MDD (major  depressive disorder), recurrent episode, mild ([place])  F33.0        Plan:  Patient is to use CBT, mindfulness and coping skills to help manage / decrease symptoms associated with their diagnosis.  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  Long-term goals:   The patient will report sustained reduction in symptoms of depression for at least 3 consecutive months.    Short-term goal:  The patient will learn and apply coping skills as identified in session.       2.   The patient will identify outlets for stress mgmt.       3.   Patient to identify and engage in effective communication in her marital relationship related to stressors as they arise.       4.   Patient to care for herself by getting adequate rest.   [place], [facility]

## 2022-09-23 ENCOUNTER — Other Ambulatory Visit: Payer: Self-pay | Admitting: Family

## 2022-09-23 DIAGNOSIS — R7303 Prediabetes: Secondary | ICD-10-CM

## 2022-10-08 ENCOUNTER — Ambulatory Visit: Payer: 59 | Admitting: Mental Health

## 2022-10-15 ENCOUNTER — Encounter: Payer: Self-pay | Admitting: Adult Health

## 2022-10-15 ENCOUNTER — Ambulatory Visit (INDEPENDENT_AMBULATORY_CARE_PROVIDER_SITE_OTHER): Payer: 59 | Admitting: Adult Health

## 2022-10-15 DIAGNOSIS — G4700 Insomnia, unspecified: Secondary | ICD-10-CM | POA: Diagnosis not present

## 2022-10-15 DIAGNOSIS — F330 Major depressive disorder, recurrent, mild: Secondary | ICD-10-CM | POA: Diagnosis not present

## 2022-10-15 DIAGNOSIS — F411 Generalized anxiety disorder: Secondary | ICD-10-CM

## 2022-10-15 DIAGNOSIS — F410 Panic disorder [episodic paroxysmal anxiety] without agoraphobia: Secondary | ICD-10-CM | POA: Diagnosis not present

## 2022-10-15 DIAGNOSIS — F430 Acute stress reaction: Secondary | ICD-10-CM

## 2022-10-15 NOTE — Progress Notes (Signed)
Jill Pittman [id number] 04/24/1961 62 y.o.  Subjective:   Patient ID:  Jill Pittman is a 62 y.o. (DOB 03/25/1961) female.  Chief Complaint: No chief complaint on file.   HPI Dekyra Cardo presents to the office today for follow-up of MDD, GAD, insomnia and panic attacks.  Previously seen by Dr. Modesta Messing for medication management.   Describes mood today as "better". Pleasant. Reports some tearfulness. Mood symptoms - reports decreased depression - "not too bad". Reports decreased anxiety and irritability - "a little less". Reports worry, rumination, and over thinking - "mostly over money". Denies recent panic attacks. Mood is consistent. Feels like medications are helpful. Seeing therapist - Lanetta Inch. Stable interest and motivation. Taking medications as prescribed.  Energy levels "lower - more tired". Active, does not have a regular exercise routine.   Enjoys some usual interests and activities. Married. Lives with husband. Has one step son. Friends and family in the area. Appetite adequate. Weight stable 139 - 65". Sleeps well most nights. Averages 8 hours. Focus and concentration "better than it was". Completing tasks. Managing aspects of household. Works for [employer] - 40 hours - hybrid schedule. Denies SI or HI.  Denies AH or VH. Denies self harm. Denies substance use.  Previous medication trials: Lexapro, fluoxetine, bupropion, quetiapine (fatigue)    PHQ2-9    [place] Office Visit from 05/07/2022 in [place] Visit from 11/06/2021 in [place]  PHQ-2 Total Score 0 0  PHQ-9 Total Score 0 --        Review of Systems:  Review of Systems  Musculoskeletal:  Negative for gait problem.  Neurological:  Negative for tremors.  Psychiatric/Behavioral:         Please refer to HPI    Medications: I have reviewed the patient's current medications.  Current Outpatient Medications  Medication Sig  Dispense Refill   brexpiprazole (REXULTI) 1 MG TABS tablet Take 1 tablet (1 mg total) by mouth daily. 90 tablet 3   buPROPion (WELLBUTRIN XL) 300 MG 24 hr tablet Take 1 tablet (300 mg total) by mouth daily. 90 tablet 3   hydrOXYzine (ATARAX) 25 MG tablet Take 1 tablet (25 mg total) by mouth daily as needed. for anxiety 90 tablet 3   levothyroxine (SYNTHROID) 150 MCG tablet Take 1 tablet (150 mcg total) by mouth daily before breakfast. 90 tablet 1   metFORMIN (GLUCOPHAGE) 500 MG tablet TAKE 1 TABLET BY MOUTH TWICE  DAILY WITH A MEAL 180 tablet 0   simvastatin (ZOCOR) 10 MG tablet Take 1 tablet (10 mg total) by mouth daily. 90 tablet 1   terbinafine (LAMISIL) 250 MG tablet Please take one a day x 7days, repeat every 4 weeks x 4 months 28 tablet 0   traZODone (DESYREL) 100 MG tablet Take 1 tablet (100 mg total) by mouth at bedtime as needed. 90 tablet 3   venlafaxine XR (EFFEXOR-XR) 75 MG 24 hr capsule Take 3 capsules (225 mg total) by mouth daily with breakfast. 270 capsule 3   No current facility-administered medications for this visit.    Medication Side Effects: None  Allergies:  Allergies  Allergen Reactions   Penicillins Rash    Past Medical History:  Diagnosis Date   History of craniotomy    Hx of resection of meningioma    Hyperlipidemia    Prediabetes     Past Medical History, Surgical history, Social history, and Family history were reviewed and updated as appropriate.   Please see review  of systems for further details on the patient's review from today.   Objective:   Physical Exam:  There were no vitals taken for this visit.  Physical Exam Constitutional:      General: She is not in acute distress. Musculoskeletal:        General: No deformity.  Neurological:     Mental Status: She is alert and oriented to person, place, and time.     Coordination: Coordination normal.  Psychiatric:        Attention and Perception: Attention and perception normal. She does not  perceive auditory or visual hallucinations.        Mood and Affect: Mood normal. Mood is not anxious or depressed. Affect is not labile, blunt, angry or inappropriate.        Speech: Speech normal.        Behavior: Behavior normal.        Thought Content: Thought content normal. Thought content is not paranoid or delusional. Thought content does not include homicidal or suicidal ideation. Thought content does not include homicidal or suicidal plan.        Cognition and Memory: Cognition and memory normal.        Judgment: Judgment normal.     Comments: Insight intact     Lab Review:     Component Value Date/Time   NA 138 05/07/2022 0848   K 4.2 05/07/2022 0848   CL 105 05/07/2022 0848   CO2 25 05/07/2022 0848   GLUCOSE 119 (H) 05/07/2022 0848   BUN 13 05/07/2022 0848   CREATININE 0.68 05/07/2022 0848   CALCIUM 8.9 05/07/2022 0848   PROT 7.2 05/07/2022 0848   ALBUMIN 4.3 05/07/2022 0848   AST 17 05/07/2022 0848   ALT 24 05/07/2022 0848   ALKPHOS 89 05/07/2022 0848   BILITOT 0.4 05/07/2022 0848    No results found for: "WBC", "RBC", "HGB", "HCT", "PLT", "MCV", "MCH", "MCHC", "RDW", "LYMPHSABS", "MONOABS", "EOSABS", "BASOSABS"  No results found for: "POCLITH", "LITHIUM"   No results found for: "PHENYTOIN", "PHENOBARB", "VALPROATE", "CBMZ"   .res Assessment: Plan:    Plan:  PDMP reviewed  Rexulti '1mg'$  tablet daily  Buproprion XL300 mg daily  Venlafaxine 225 mg daily   Trazodone 100 mg at night as needed for sleep Hydroxyzine 25 mg daily as needed for anxiety   Patient advised to contact office with any questions, adverse effects, or acute worsening in signs and symptoms.  Discussed potential metabolic side effects associated with atypical antipsychotics, as well as potential risk for movement side effects. Advised pt to contact office if movement side effects occur.   RTC 2 months  Diagnoses and all orders for this visit:  MDD (major depressive disorder),  recurrent episode, mild (H[facility])  GAD (generalized anxiety disorder)  Insomnia, unspecified type  Panic attack as reaction to stress     Please see After Visit Summary for patient specific instructions.  Future Appointments  Date Time Provider [place]  11/05/2022  8:00 AM Jeanie Sewer, NP [facility]    No orders of the defined types were placed in this encounter.   -------------------------------

## 2022-11-05 ENCOUNTER — Ambulatory Visit (INDEPENDENT_AMBULATORY_CARE_PROVIDER_SITE_OTHER): Payer: 59 | Admitting: Family

## 2022-11-05 VITALS — BP 123/76 | HR 88 | Temp 97.7°F | Ht 65.0 in | Wt 143.4 lb

## 2022-11-05 DIAGNOSIS — R7303 Prediabetes: Secondary | ICD-10-CM | POA: Diagnosis not present

## 2022-11-05 DIAGNOSIS — R21 Rash and other nonspecific skin eruption: Secondary | ICD-10-CM

## 2022-11-05 DIAGNOSIS — E559 Vitamin D deficiency, unspecified: Secondary | ICD-10-CM

## 2022-11-05 DIAGNOSIS — F411 Generalized anxiety disorder: Secondary | ICD-10-CM | POA: Diagnosis not present

## 2022-11-05 DIAGNOSIS — R5383 Other fatigue: Secondary | ICD-10-CM

## 2022-11-05 DIAGNOSIS — F331 Major depressive disorder, recurrent, moderate: Secondary | ICD-10-CM

## 2022-11-05 DIAGNOSIS — E782 Mixed hyperlipidemia: Secondary | ICD-10-CM

## 2022-11-05 DIAGNOSIS — E039 Hypothyroidism, unspecified: Secondary | ICD-10-CM

## 2022-11-05 LAB — VITAMIN D 25 HYDROXY (VIT D DEFICIENCY, FRACTURES): VITD: 14.9 ng/mL — ABNORMAL LOW (ref 30.00–100.00)

## 2022-11-05 MED ORDER — METFORMIN HCL 500 MG PO TABS: 500.0000 mg | ORAL_TABLET | Freq: Two times a day (BID) | ORAL | 1 refills | Status: DC

## 2022-11-05 NOTE — Assessment & Plan Note (Signed)
chronic seeing Psych, taking Vistaril prn, Effexor, Wellbutrin, & Rexulti (started 3 mos ago) f/u prn

## 2022-11-05 NOTE — Progress Notes (Signed)
Patient ID: Jill Pittman, female    DOB: October 28, 1960, 62 y.o.   MRN: [id number]  Chief Complaint  Patient presents with   Hyperlipidemia   Anxiety    HPI: Fatigue/rash:  pt c/o new fatigue today, starting in the last few weeks, reports all meds the same, did start [place] in Jan but was fine until just recently. Denies any sleep problems, not exercising, takes Biotin OTC, no vitamin D. Also reports a rash off & on for the last year on the top or both feet, pinpoint red rash, mild, but is itchy at times. Wears different shoes, does not walk outside barefoot. Hypothyroidism: Patient presents today for followup of Hypothyroidism.  Patient reports positive compliance with daily medication.  Patient denies any of the following symptoms: fatigue, cold intolerance, constipation, weight gain or inability to lose weight, muscle weakness, mental slowing, dry hair and skin. Hyperlipidemia: Patient is currently maintained on the following medication for hyperlipidemia: Zocor. Patient denies myalgia or other side effects. Patient reports good compliance with low fat/low cholesterol diet.  Prediabetes:  pt has been on Metformin 500mg  bid for years. Last A1C was 07/2022 was 6.3. pt also controlling her diet and weight. Anxiety/Depression: Patient complains of anxiety disorder and depression.   She has the following symptoms: chest pain, difficulty concentrating, feelings of losing control, shortness of breath, sweating. Onset of symptoms was approximately  years ago, She denies current suicidal and homicidal ideation.  Possible organic causes contributing are: none. Risk factors: previous episode of depression  Previous treatment includes BuSpar, Wellbutrin, and Effexor and individual therapy.  Currently on Effexor, Wellbutrin, Hydroxyzine & Rexulti.  Assessment & Plan:  Fatigue, unspecified type - checking Vit D level, last TSH, CBC all wnl. Also advised on increasing excericse qd as able, can take  Vitamin B complex OTC qd, eat a low white carb diet including sweets.  -     VITAMIN D 25 Hydroxy (Vit-D Deficiency, Fractures)  Prediabetes Assessment & Plan: chronic last A1C at 6.3 taking Metformin 500mg  bid, sending refill f/u in 6 mos  Orders: -     metFORMIN HCl; Take 1 tablet (500 mg total) by mouth 2 (two) times daily with a meal.  Dispense: 180 tablet; Refill: 1  Acquired hypothyroidism Assessment & Plan: chronic pt taking Levothyroxine qd no refill needed today f/u in 6 mos   Generalized anxiety disorder Assessment & Plan: chronic seeing Psych, taking Vistaril prn, Effexor, Wellbutrin, & Rexulti (started 3 mos ago) f/u prn   Moderate episode of recurrent major depressive disorder Assessment & Plan: chronic, followed by [facility] taking Wellbutrin & Effexor qd, Trazodone for sleep, Resulti added recently f/u prn  Mixed hyperlipidemia Assessment & Plan: chronic taking zocor 10mg  qd last lipids wnl f/u in 6 mos  Rash, skin - top of bilateral feet, mild, erthematous, pinpoint, itchy. Advised ok to continue Hydrocortisone cream OTC prn. Advised to schedule a full skin cancer screening (pt has very fair skin) appt with Derm and can also discuss possible causes of rash.    Subjective:    Outpatient Medications Prior to Visit  Medication Sig Dispense Refill   brexpiprazole (REXULTI) 1 MG TABS tablet Take 1 tablet (1 mg total) by mouth daily. 90 tablet 3   buPROPion (WELLBUTRIN XL) 300 MG 24 hr tablet Take 1 tablet (300 mg total) by mouth daily. 90 tablet 3   hydrOXYzine (ATARAX) 25 MG tablet Take 1 tablet (25 mg total) by mouth daily as needed. for anxiety 90 tablet 3  levothyroxine (SYNTHROID) 150 MCG tablet Take 1 tablet (150 mcg total) by mouth daily before breakfast. 90 tablet 1   simvastatin (ZOCOR) 10 MG tablet Take 1 tablet (10 mg total) by mouth daily. 90 tablet 1   traZODone (DESYREL) 100 MG tablet Take 1 tablet (100 mg total) by mouth at bedtime as  needed. 90 tablet 3   venlafaxine XR (EFFEXOR-XR) 75 MG 24 hr capsule Take 3 capsules (225 mg total) by mouth daily with breakfast. 270 capsule 3   metFORMIN (GLUCOPHAGE) 500 MG tablet TAKE 1 TABLET BY MOUTH TWICE  DAILY WITH A MEAL 180 tablet 0   terbinafine (LAMISIL) 250 MG tablet Please take one a day x 7days, repeat every 4 weeks x 4 months (Patient not taking: Reported on 11/05/2022) 28 tablet 0   No facility-administered medications prior to visit.   Past Medical History:  Diagnosis Date   History of craniotomy    Hx of resection of meningioma    Hyperlipidemia    Prediabetes    No past surgical history on file. Allergies  Allergen Reactions   Penicillins Rash      Objective:    Physical Exam Vitals and nursing note reviewed.  Constitutional:      Appearance: Normal appearance.  Cardiovascular:     Rate and Rhythm: Normal rate and regular rhythm.  Pulmonary:     Effort: Pulmonary effort is normal.     Breath sounds: Normal breath sounds.  Musculoskeletal:        General: Normal range of motion.  Skin:    General: Skin is warm and dry.  Neurological:     Mental Status: She is alert.  Psychiatric:        Mood and Affect: Mood normal.        Behavior: Behavior normal.    BP 123/76 (BP Location: Left Arm, Patient Position: Sitting, Cuff Size: Normal)   Pulse 88   Temp 97.7 F (36.5 C) (Temporal)   Ht 5\' 5"  (1.651 m)   Wt 143 lb 6.4 oz (65 kg)   SpO2 95%   BMI 23.86 kg/m  Wt Readings from Last 3 Encounters:  11/05/22 143 lb 6.4 oz (65 kg)  05/07/22 139 lb 12.8 oz (63.4 kg)  11/06/21 139 lb 9.6 oz (63.3 kg)       Jeanie Sewer, NP

## 2022-11-05 NOTE — Assessment & Plan Note (Signed)
chronic, followed by [facility] taking Wellbutrin & Effexor qd, Trazodone for sleep, Resulti added recently f/u prn

## 2022-11-05 NOTE — Assessment & Plan Note (Signed)
chronic pt taking Levothyroxine qd no refill needed today f/u in 6 mos

## 2022-11-05 NOTE — Assessment & Plan Note (Signed)
chronic taking zocor 10mg  qd last lipids wnl f/u in 6 mos

## 2022-11-05 NOTE — Assessment & Plan Note (Addendum)
chronic last A1C at 6.3 taking Metformin 500mg  bid, sending refill f/u in 6 mos

## 2022-11-07 MED ORDER — VITAMIN D3 1.25 MG (50000 UT) PO CAPS
50000.0000 [IU] | ORAL_CAPSULE | ORAL | 0 refills | Status: DC
Start: 2022-11-07 — End: 2023-05-27

## 2022-11-07 NOTE — Progress Notes (Signed)
see MyChart lab message

## 2022-11-07 NOTE — Addendum Note (Signed)
Addended byJeanie Sewer on: 11/07/2022 10:59 PM   Modules accepted: Orders

## 2022-11-28 ENCOUNTER — Encounter: Payer: Self-pay | Admitting: Family

## 2022-12-06 ENCOUNTER — Other Ambulatory Visit: Payer: Self-pay | Admitting: Family

## 2022-12-06 DIAGNOSIS — Z0000 Encounter for general adult medical examination without abnormal findings: Secondary | ICD-10-CM

## 2022-12-11 ENCOUNTER — Other Ambulatory Visit: Payer: Self-pay | Admitting: Family

## 2022-12-11 DIAGNOSIS — R7303 Prediabetes: Secondary | ICD-10-CM

## 2022-12-11 DIAGNOSIS — E039 Hypothyroidism, unspecified: Secondary | ICD-10-CM

## 2022-12-14 ENCOUNTER — Ambulatory Visit: Payer: 59 | Admitting: Adult Health

## 2022-12-14 ENCOUNTER — Encounter: Payer: Self-pay | Admitting: Adult Health

## 2022-12-14 DIAGNOSIS — G4700 Insomnia, unspecified: Secondary | ICD-10-CM

## 2022-12-14 DIAGNOSIS — F411 Generalized anxiety disorder: Secondary | ICD-10-CM

## 2022-12-14 DIAGNOSIS — F410 Panic disorder [episodic paroxysmal anxiety] without agoraphobia: Secondary | ICD-10-CM

## 2022-12-14 DIAGNOSIS — F330 Major depressive disorder, recurrent, mild: Secondary | ICD-10-CM | POA: Diagnosis not present

## 2022-12-14 DIAGNOSIS — F430 Acute stress reaction: Secondary | ICD-10-CM

## 2022-12-14 MED ORDER — BUPROPION HCL ER (XL) 150 MG PO TB24: 150.0000 mg | ORAL_TABLET | Freq: Every day | ORAL | 1 refills | Status: DC

## 2022-12-14 NOTE — Progress Notes (Signed)
Jill Pittman [id number] [birth year] 62 y.o.  Subjective:   Patient ID:  Jill Pittman is a 62 y.o. (DOB 19-Apr-1961) female.  Chief Complaint: No chief complaint on file.   HPI Jill Pittman presents to the office today for follow-up of MDD, GAD, insomnia and panic attacks.  Previously seen by Dr. Vanetta Shawl for medication management.   Describes mood today as "not where I want it to be". Pleasant. Reports some tearfulness. Mood symptoms - reports decreased depression - "having days of it". Reports anxiety and irritability - "more situational". Reports worry, rumination, and over thinking - "about everything". Denies recent panic attacks. Mood is "lower". Stating "I'm not doing as well as I was". Reporting increased situational stressors. Feels like medications are helpful. Seeing therapist - Elio Forget. Stable interest and motivation. Taking medications as prescribed.  Energy levels "lower". Active, does not have a regular exercise routine.   Enjoys some usual interests and activities. Married. Lives with husband. Has one step son. Friends and family in the area. Appetite adequate. Weight gain 139 to 143 pounds - 65". Sleeps well most nights. Averages 8 hours. Focus and concentration "was a little better, but has been struggling again". Completing tasks. Managing aspects of household. Works for [employer] - 40 hours - hybrid schedule. Denies SI or HI.  Denies AH or VH. Denies self harm. Denies substance use.  Previous medication trials: Lexapro, fluoxetine, bupropion, quetiapine (fatigue)    GAD-7    Flowsheet Row Office Visit from 11/05/2022 in [facility]  Total GAD-7 Score 3      PHQ2-9    Flowsheet Row Office Visit from 11/05/2022 in [facility] Visit from 05/07/2022 in [facility] from 11/06/2021 in [facility]  PHQ-2 Total Score 1 0 0  PHQ-9 Total Score 7 0  --        Review of Systems:  Review of Systems  Musculoskeletal:  Negative for gait problem.  Neurological:  Negative for tremors.  Psychiatric/Behavioral:         Please refer to HPI    Medications: I have reviewed the patient's current medications.  Current Outpatient Medications  Medication Sig Dispense Refill   brexpiprazole (REXULTI) 1 MG TABS tablet Take 1 tablet (1 mg total) by mouth daily. 90 tablet 3   buPROPion (WELLBUTRIN XL) 300 MG 24 hr tablet Take 1 tablet (300 mg total) by mouth daily. 90 tablet 3   Cholecalciferol (VITAMIN D3) 1.25 MG (50000 UT) capsule Take 1 capsule (50,000 Units total) by mouth once a week. AFTER the last weekly pill, start a daily OTC Vitamin D3 up to 2,000units. 12 capsule 0   hydrOXYzine (ATARAX) 25 MG tablet Take 1 tablet (25 mg total) by mouth daily as needed. for anxiety 90 tablet 3   levothyroxine (SYNTHROID) 150 MCG tablet TAKE 1 TABLET BY MOUTH DAILY  BEFORE BREAKFAST 90 tablet 3   metFORMIN (GLUCOPHAGE) 500 MG tablet TAKE 1 TABLET BY MOUTH TWICE  DAILY WITH A MEAL 180 tablet 3   simvastatin (ZOCOR) 10 MG tablet Take 1 tablet (10 mg total) by mouth daily. 90 tablet 1   traZODone (DESYREL) 100 MG tablet Take 1 tablet (100 mg total) by mouth at bedtime as needed. 90 tablet 3   venlafaxine XR (EFFEXOR-XR) 75 MG 24 hr capsule Take 3 capsules (225 mg total) by mouth daily with breakfast. 270 capsule 3   No current facility-administered medications for this visit.  Medication Side Effects: None  Allergies:  Allergies  Allergen Reactions   Penicillins Rash    Past Medical History:  Diagnosis Date   History of craniotomy    Hx of resection of meningioma    Hyperlipidemia    Prediabetes     Past Medical History, Surgical history, Social history, and Family history were reviewed and updated as appropriate.   Please see review of systems for further details on the patient's review from today.   Objective:   Physical Exam:   There were no vitals taken for this visit.  Physical Exam Constitutional:      General: She is not in acute distress. Musculoskeletal:        General: No deformity.  Neurological:     Mental Status: She is alert and oriented to person, place, and time.     Coordination: Coordination normal.  Psychiatric:        Attention and Perception: Attention and perception normal. She does not perceive auditory or visual hallucinations.        Mood and Affect: Mood normal. Mood is not anxious or depressed. Affect is not labile, blunt, angry or inappropriate.        Speech: Speech normal.        Behavior: Behavior normal.        Thought Content: Thought content normal. Thought content is not paranoid or delusional. Thought content does not include homicidal or suicidal ideation. Thought content does not include homicidal or suicidal plan.        Cognition and Memory: Cognition and memory normal.        Judgment: Judgment normal.     Comments: Insight intact     Lab Review:     Component Value Date/Time   NA 138 05/07/2022 0848   K 4.2 05/07/2022 0848   CL 105 05/07/2022 0848   CO2 25 05/07/2022 0848   GLUCOSE 119 (H) 05/07/2022 0848   BUN 13 05/07/2022 0848   CREATININE 0.68 05/07/2022 0848   CALCIUM 8.9 05/07/2022 0848   PROT 7.2 05/07/2022 0848   ALBUMIN 4.3 05/07/2022 0848   AST 17 05/07/2022 0848   ALT 24 05/07/2022 0848   ALKPHOS 89 05/07/2022 0848   BILITOT 0.4 05/07/2022 0848    No results found for: "WBC", "RBC", "HGB", "HCT", "PLT", "MCV", "MCH", "MCHC", "RDW", "LYMPHSABS", "MONOABS", "EOSABS", "BASOSABS"  No results found for: "POCLITH", "LITHIUM"   No results found for: "PHENYTOIN", "PHENOBARB", "VALPROATE", "CBMZ"   .res Assessment: Plan:    Plan:  PDMP reviewed  Rexulti 1mg  tablet daily  Increase Buproprion XL300 to 450 mg daily  Venlafaxine 225 mg daily   Trazodone 100 mg at night as needed for sleep Hydroxyzine 25 mg daily as needed for anxiety    Patient advised to contact office with any questions, adverse effects, or acute worsening in signs and symptoms.  Discussed potential metabolic side effects associated with atypical antipsychotics, as well as potential risk for movement side effects. Advised pt to contact office if movement side effects occur.   RTC 2 months   There are no diagnoses linked to this encounter.   Please see After Visit Summary for patient specific instructions.  Future Appointments  Date Time Provider Department Center  01/11/2023 11:50 AM [facility]    No orders of the defined types were placed in this encounter.   -------------------------------

## 2022-12-18 ENCOUNTER — Other Ambulatory Visit: Payer: Self-pay | Admitting: Psychiatry

## 2022-12-19 ENCOUNTER — Telehealth: Payer: Self-pay | Admitting: Adult Health

## 2022-12-19 MED ORDER — VENLAFAXINE HCL ER 75 MG PO CP24: 225.0000 mg | ORAL_CAPSULE | Freq: Every day | ORAL | 0 refills | Status: DC

## 2022-12-19 NOTE — Telephone Encounter (Signed)
Patient has RF available at [facility] but is out of medication and would like a 30-day script sent to [facility]. Sent.

## 2022-12-19 NOTE — Telephone Encounter (Signed)
Pt LVM 5/14 @ 5:58p.  She is down to 1 pill of Venlafaxine.  She would like it sent to   [facility] [address], [place] - [address], [place] [address] Phone: [phone]  Fax: [phone]   Next appt 6/10

## 2023-01-01 ENCOUNTER — Other Ambulatory Visit: Payer: Self-pay | Admitting: Adult Health

## 2023-01-01 DIAGNOSIS — F330 Major depressive disorder, recurrent, mild: Secondary | ICD-10-CM

## 2023-01-02 NOTE — Telephone Encounter (Signed)
?   Which pharmacy, uses [facility].

## 2023-01-11 ENCOUNTER — Ambulatory Visit: Payer: 59

## 2023-01-14 ENCOUNTER — Ambulatory Visit: Payer: 59 | Admitting: Adult Health

## 2023-01-18 ENCOUNTER — Ambulatory Visit: Payer: 59

## 2023-01-20 ENCOUNTER — Other Ambulatory Visit: Payer: Self-pay | Admitting: Family

## 2023-01-28 ENCOUNTER — Ambulatory Visit (INDEPENDENT_AMBULATORY_CARE_PROVIDER_SITE_OTHER): Payer: Self-pay | Admitting: Adult Health

## 2023-01-28 DIAGNOSIS — Z0389 Encounter for observation for other suspected diseases and conditions ruled out: Secondary | ICD-10-CM

## 2023-01-28 NOTE — Progress Notes (Signed)
Patient no show appointment. ? ?

## 2023-02-03 ENCOUNTER — Other Ambulatory Visit: Payer: Self-pay | Admitting: Adult Health

## 2023-02-03 DIAGNOSIS — F330 Major depressive disorder, recurrent, mild: Secondary | ICD-10-CM

## 2023-02-04 ENCOUNTER — Ambulatory Visit (INDEPENDENT_AMBULATORY_CARE_PROVIDER_SITE_OTHER): Payer: 59

## 2023-02-04 ENCOUNTER — Ambulatory Visit (HOSPITAL_BASED_OUTPATIENT_CLINIC_OR_DEPARTMENT_OTHER): Payer: 59 | Admitting: Student

## 2023-02-04 ENCOUNTER — Encounter (HOSPITAL_BASED_OUTPATIENT_CLINIC_OR_DEPARTMENT_OTHER): Payer: Self-pay | Admitting: Student

## 2023-02-04 DIAGNOSIS — M79644 Pain in right finger(s): Secondary | ICD-10-CM

## 2023-02-04 NOTE — Progress Notes (Signed)
Chief Complaint: Right finger pain     History of Present Illness:    Jill Pittman is a 62 y.o. female presenting today for evaluation of pain in her right fourth and fifth fingers.  She has noticed this for the last 2 weeks without any known injury.  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  Her workload had been increasing as of late.  Most of her discomfort is located on the dorsal aspect of the fingers, and travels slightly down into the ulnar aspect of the hand.  Denies any numbness or tingling but states that it does feel weak at times.  Has tried an ulnar gutter brace as well as Advil.  Does have a history of trigger finger in her right fourth finger.   Surgical History:   None  PMH/PSH/Family History/Social History/Meds/Allergies:    Past Medical History:  Diagnosis Date   History of craniotomy    Hx of resection of meningioma    Hyperlipidemia    Prediabetes    History reviewed. No pertinent surgical history. Social History   Socioeconomic History   Marital status: Married    Spouse name: Not on file   Number of children: Not on file   Years of education: Not on file   Highest education level: Not on file  Occupational History   Not on file  Tobacco Use   Smoking status: Never   Smokeless tobacco: Never  Substance and Sexual Activity   Alcohol use: Not on file   Drug use: Not on file   Sexual activity: Not on file  Other Topics Concern   Not on file  Social History Narrative   pt married, moved from [place] during pandemic, rented RV and travelled all over [place] for 1 year   Social Determinants of Health   Financial Resource Strain: Not on file  Food Insecurity: Not on file  Transportation Needs: Not on file  Physical Activity: Not on file  Stress: Not on file  Social Connections: Not on file   Family History  Problem Relation Age of Onset    Alcohol abuse Mother    Breast cancer Maternal Grandmother    Alcohol abuse Maternal Grandfather    Allergies  Allergen Reactions   Penicillins Rash   Current Outpatient Medications  Medication Sig Dispense Refill   buPROPion (WELLBUTRIN XL) 150 MG 24 hr tablet Take 1 tablet (150 mg total) by mouth daily. 90 tablet 1   buPROPion (WELLBUTRIN XL) 300 MG 24 hr tablet Take 1 tablet (300 mg total) by mouth daily. 90 tablet 3   Cholecalciferol (VITAMIN D3) 1.25 MG (50000 UT) capsule Take 1 capsule (50,000 Units total) by mouth once a week. AFTER the last weekly pill, start a daily OTC Vitamin D3 up to 2,000units. 12 capsule 0   hydrOXYzine (ATARAX) 25 MG tablet Take 1 tablet (25 mg total) by mouth daily as needed. for anxiety 90 tablet 3   levothyroxine (SYNTHROID) 150 MCG tablet TAKE 1 TABLET BY MOUTH DAILY  BEFORE BREAKFAST 90 tablet 3   metFORMIN (GLUCOPHAGE) 500 MG tablet TAKE 1 TABLET BY MOUTH TWICE  DAILY WITH A MEAL 180 tablet 3   REXULTI 1 MG TABS tablet Take 1 tablet by mouth  once daily 30 tablet 0   simvastatin (ZOCOR) 10 MG tablet Take 1 tablet (10 mg total) by mouth daily. 90 tablet 1   traZODone (DESYREL) 100 MG tablet Take 1 tablet (100 mg total) by mouth at bedtime as needed. 90 tablet 3   venlafaxine XR (EFFEXOR-XR) 75 MG 24 hr capsule Take 3 capsules (225 mg total) by mouth daily with breakfast. 90 capsule 0   No current facility-administered medications for this visit.   No results found.  Review of Systems:   A ROS was performed including pertinent positives and negatives as documented in the HPI.  Physical Exam :   Constitutional: NAD and appears stated age Neurological: Alert and oriented Psych: Appropriate affect and cooperative There were no vitals taken for this visit.   Comprehensive Musculoskeletal Exam:    No obvious deformity or erythema of the hand or fingers.  Fourth and fifth fingers are tender over the MCP joints.  Flexor and extensor mechanisms are  intact with good strength.  Patient is able to form a composite fist.  Full wrist range of motion with flexion and extension.  Negative Tinel's test at the wrist.  Radial pulse 2+.  Neurosensory exam intact.  Imaging:   Xray (right hand 3 views): Negative    I personally reviewed and interpreted the radiographs.   Assessment:   63 y.o. female with pain in her right fourth and fifth fingers that began gradually few weeks ago.  Based on her exam and history with extensive amount of typing, I do suspect that her pain is a result of tendinitis of her fourth and fifth extensor tendons.  Discussed that this is very likely an overuse type injury and would be best treated with rest as well as anti-inflammatories as needed.  She can also continue wearing the ulnar gutter brace.  Due to the nature of the injury, this may take a few weeks to see improvement.  Will plan to see her back in clinic as needed.  Plan :    -Bracing and NSAIDs as needed -Return to clinic as needed     I personally saw and evaluated the patient, and participated in the management and treatment plan.  Hazle Nordmann, PA-C Orthopedics  This document was dictated using Conservation officer, historic buildings. A reasonable attempt at proof reading has been made to minimize errors.

## 2023-02-05 ENCOUNTER — Other Ambulatory Visit: Payer: Self-pay | Admitting: Family

## 2023-02-11 ENCOUNTER — Ambulatory Visit: Payer: 59

## 2023-02-16 IMAGING — MG MM DIGITAL SCREENING BILAT W/ TOMO AND CAD
8 series · 9 of 24 positions shown · non-contrast
Comparison: Previous exam(s).

CLINICAL DATA: Screening.

EXAM:
DIGITAL SCREENING BILATERAL MAMMOGRAM WITH TOMOSYNTHESIS AND CAD
TECHNIQUE: Bilateral screening digital craniocaudal and mediolateral oblique
mammograms were obtained. Bilateral screening digital breast
tomosynthesis was performed. The images were evaluated with
computer-aided detection.

[L CC synth-2D]
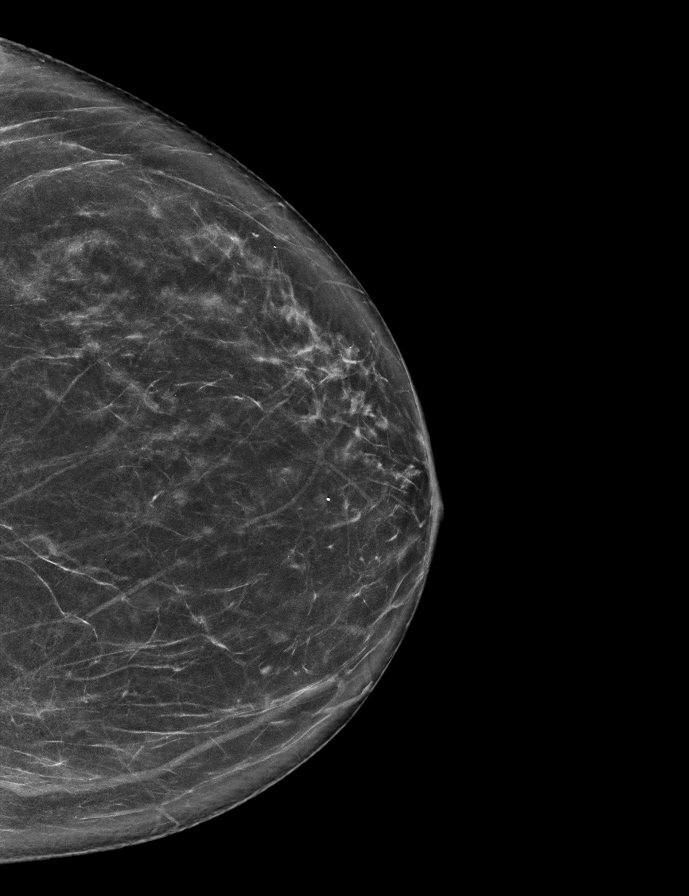

[L MLO synth-2D]
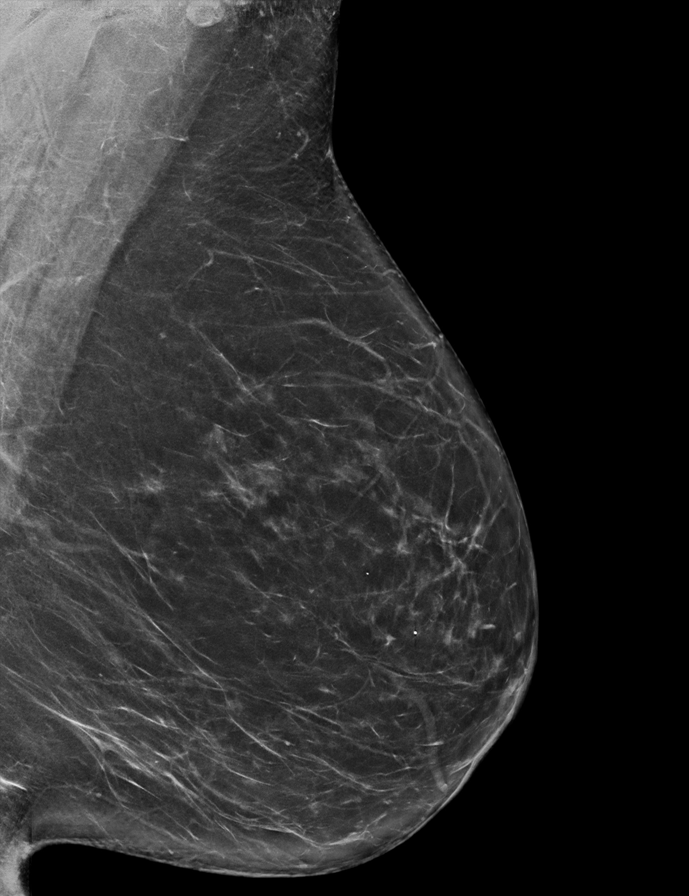

[R CC synth-2D]
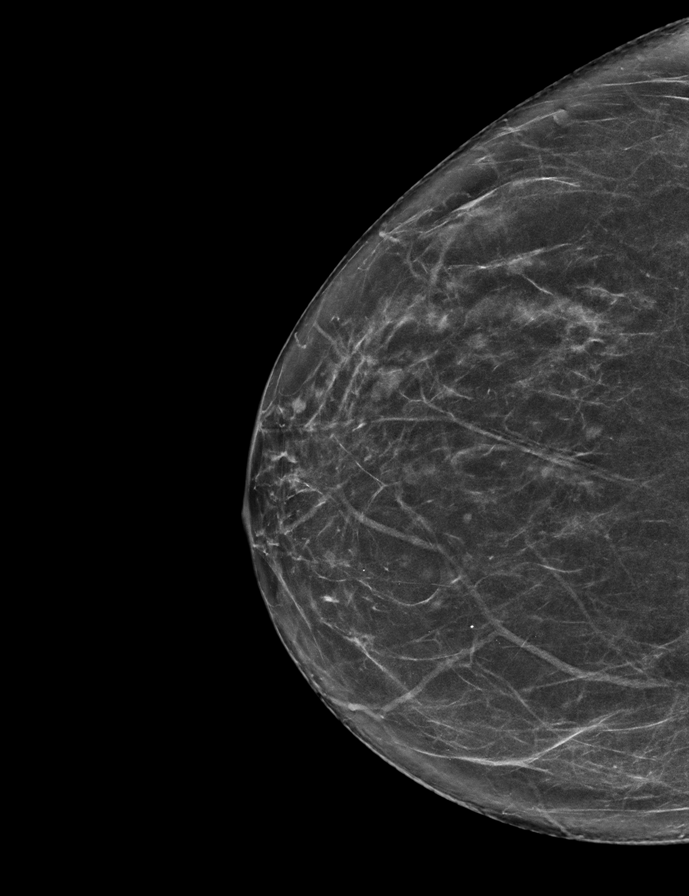

[R MLO synth-2D]
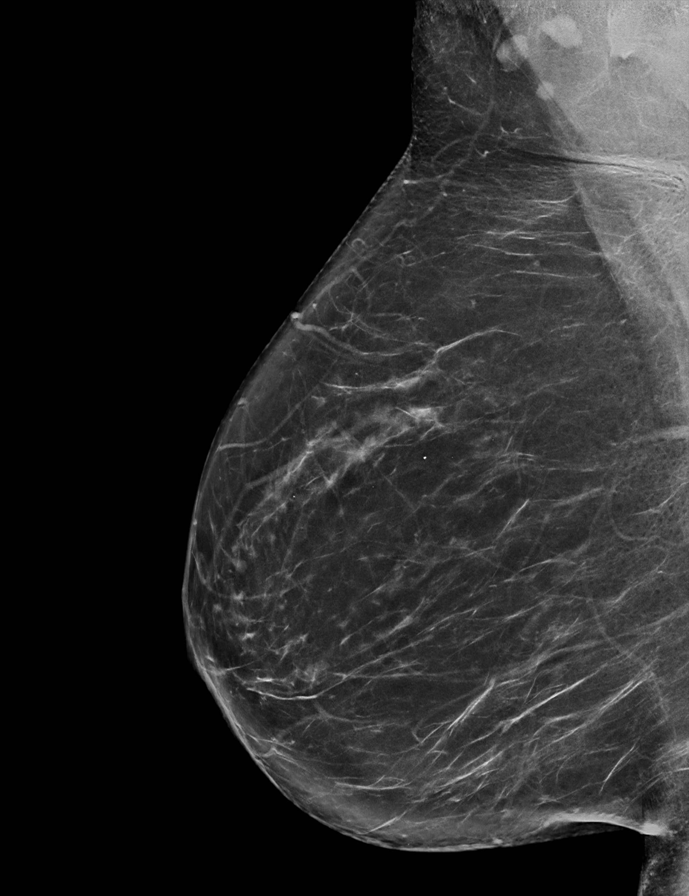

[L MLO tomo · 2 of 76 frames shown]
[frame 25/76]
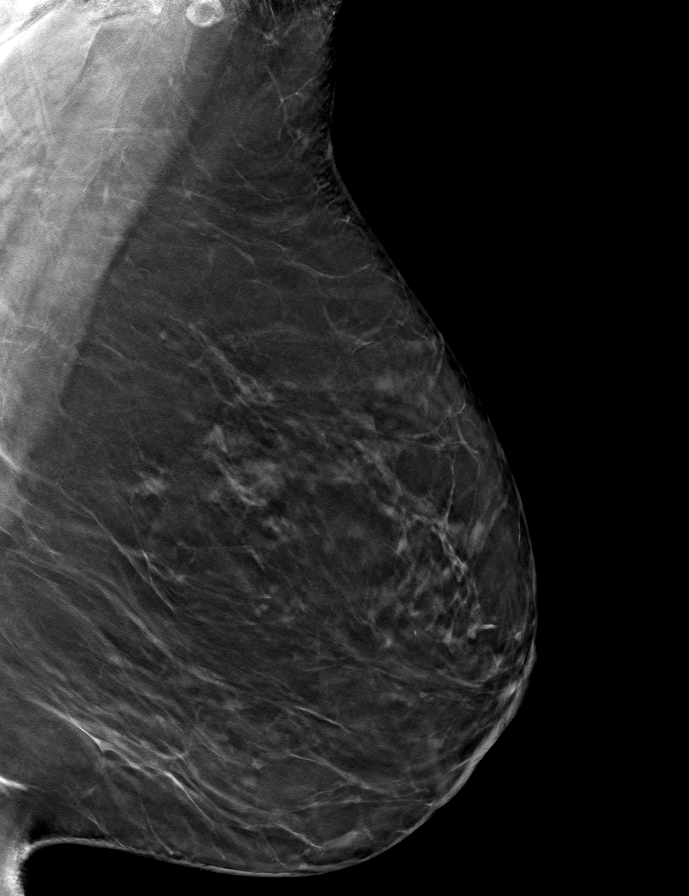
[frame 39/76]
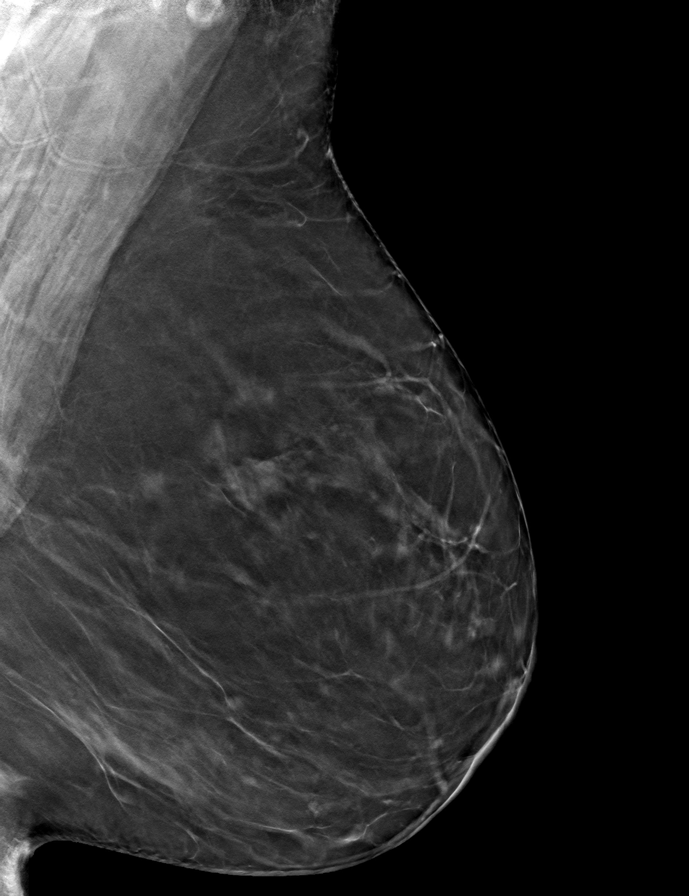

[R CC tomo · tomo slice 32/63.0]
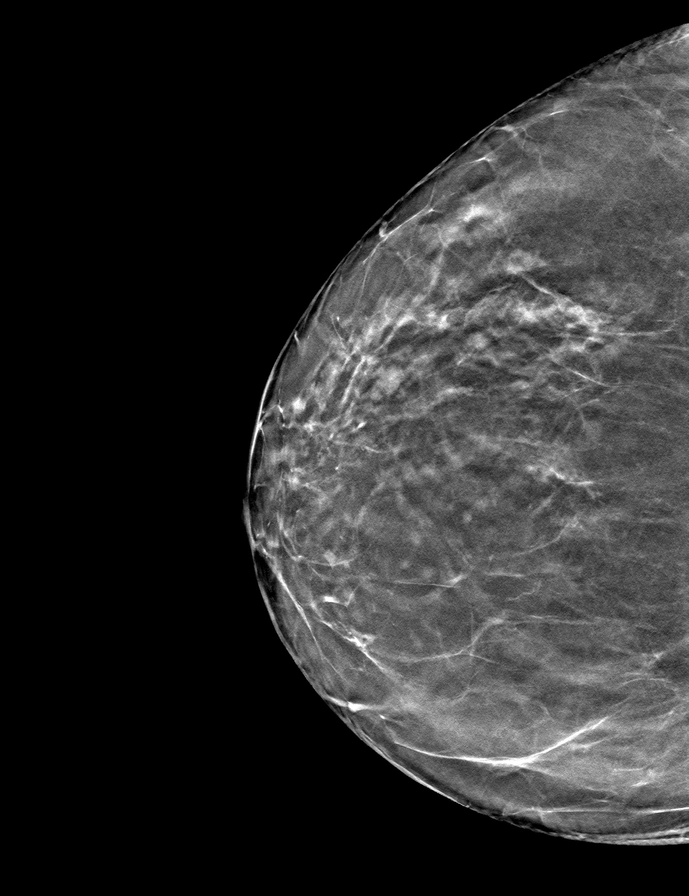

[R MLO tomo · tomo slice 39/78.0]
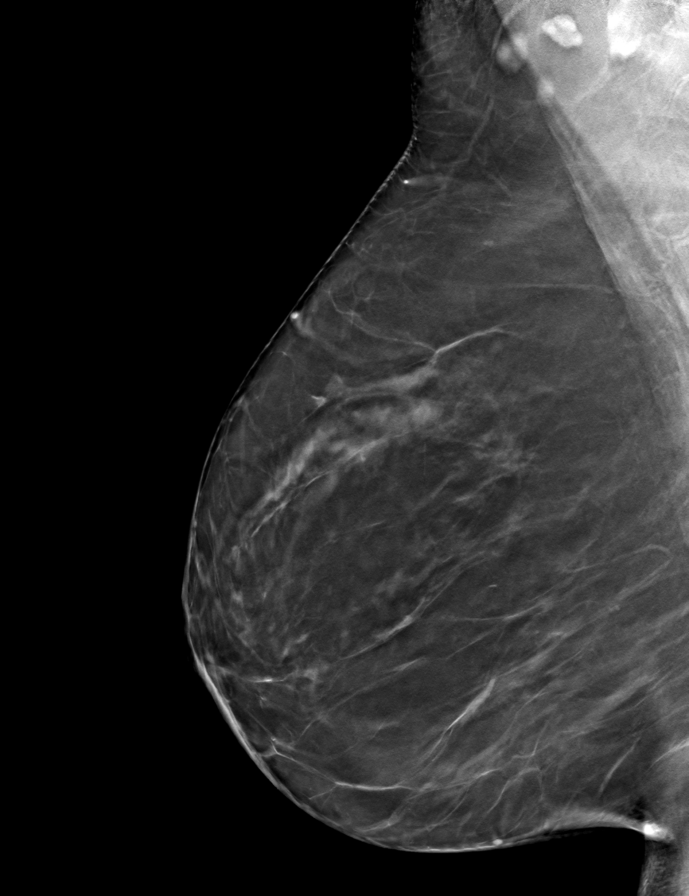

[L CC tomo · tomo slice 35/68.0]
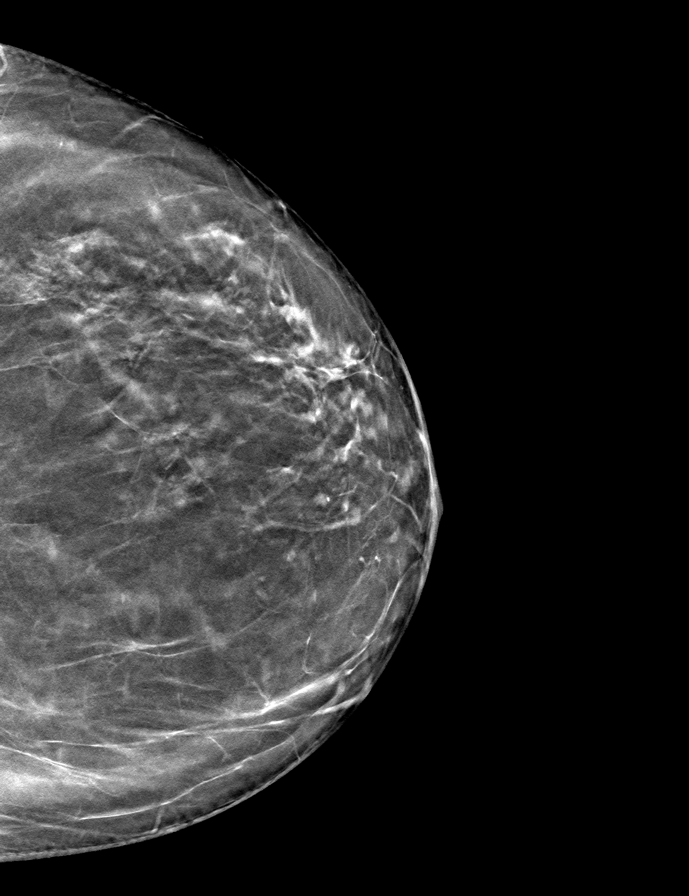

[9 of 24 positions shown; findings below may reference images not displayed]

ACR Breast Density Category b: There are scattered areas of
fibroglandular density.
FINDINGS: There are no findings suspicious for malignancy.
IMPRESSION: No mammographic evidence of malignancy. A result letter of this
screening mammogram will be mailed directly to the patient.

RECOMMENDATION:
Screening mammogram in one year. (Code:51-O-LD2)

BI-RADS CATEGORY  1: Negative.

## 2023-02-18 ENCOUNTER — Ambulatory Visit: Admission: RE | Admit: 2023-02-18 | Payer: 59 | Source: Ambulatory Visit

## 2023-02-18 ENCOUNTER — Other Ambulatory Visit: Payer: Self-pay | Admitting: Family Medicine

## 2023-02-18 DIAGNOSIS — Z0000 Encounter for general adult medical examination without abnormal findings: Secondary | ICD-10-CM

## 2023-02-18 DIAGNOSIS — N644 Mastodynia: Secondary | ICD-10-CM

## 2023-05-05 ENCOUNTER — Other Ambulatory Visit: Payer: Self-pay | Admitting: Adult Health

## 2023-05-05 DIAGNOSIS — F330 Major depressive disorder, recurrent, mild: Secondary | ICD-10-CM

## 2023-05-19 ENCOUNTER — Other Ambulatory Visit: Payer: Self-pay | Admitting: Adult Health

## 2023-05-19 DIAGNOSIS — F330 Major depressive disorder, recurrent, mild: Secondary | ICD-10-CM

## 2023-05-19 DIAGNOSIS — F411 Generalized anxiety disorder: Secondary | ICD-10-CM

## 2023-05-27 ENCOUNTER — Other Ambulatory Visit: Payer: Self-pay

## 2023-05-27 ENCOUNTER — Telehealth: Payer: Self-pay | Admitting: Adult Health

## 2023-05-27 DIAGNOSIS — F330 Major depressive disorder, recurrent, mild: Secondary | ICD-10-CM

## 2023-05-27 DIAGNOSIS — E559 Vitamin D deficiency, unspecified: Secondary | ICD-10-CM

## 2023-05-27 NOTE — Telephone Encounter (Signed)
PENDED

## 2023-05-27 NOTE — Telephone Encounter (Signed)
Ronni called at 4:40 and made appt for 10/30.  Requested that her prescription for bupropion be sent in to [facility] [address], [place] - [address]

## 2023-05-28 MED ORDER — VITAMIN D3 1.25 MG (50000 UT) PO CAPS: 50000.0000 [IU] | ORAL_CAPSULE | ORAL | 0 refills | Status: AC

## 2023-05-29 ENCOUNTER — Other Ambulatory Visit: Payer: Self-pay

## 2023-05-29 ENCOUNTER — Telehealth: Payer: Self-pay | Admitting: Adult Health

## 2023-05-29 DIAGNOSIS — F411 Generalized anxiety disorder: Secondary | ICD-10-CM

## 2023-05-29 DIAGNOSIS — F330 Major depressive disorder, recurrent, mild: Secondary | ICD-10-CM

## 2023-05-29 NOTE — Telephone Encounter (Signed)
PENDED  PER TELEPHONE ENCOUNTER ON 10/21 MEDICATION HAS BEEN PENDED. I CALLED THE PHARMACY TO CHECK THEY DID NOT HAVE A RECENT RX ON FILE.    PENDED        MJ   05/27/23  4:45 PM Janeece Agee routed this conversation to [facility] Janeece Agee   [facility]   05/27/23  4:45 PM Note Ronni called at 4:40 and made appt for 10/30.  Requested that her prescription for bupropion be sent in to [facility] [address], [place] - [address]

## 2023-05-29 NOTE — Telephone Encounter (Signed)
Next visit is 06/05/23. Requesting a refill on Bupropion 300 mg called to:  [facility] [address] [place] - [address]   Phone: [phone]  Fax: [phone]

## 2023-05-30 MED ORDER — BUPROPION HCL ER (XL) 300 MG PO TB24: 300.0000 mg | ORAL_TABLET | Freq: Every day | ORAL | 0 refills | Status: DC

## 2023-06-05 ENCOUNTER — Telehealth (INDEPENDENT_AMBULATORY_CARE_PROVIDER_SITE_OTHER): Payer: 59 | Admitting: Adult Health

## 2023-06-05 ENCOUNTER — Encounter: Payer: Self-pay | Admitting: Adult Health

## 2023-06-05 DIAGNOSIS — F411 Generalized anxiety disorder: Secondary | ICD-10-CM

## 2023-06-05 DIAGNOSIS — G4700 Insomnia, unspecified: Secondary | ICD-10-CM

## 2023-06-05 DIAGNOSIS — F410 Panic disorder [episodic paroxysmal anxiety] without agoraphobia: Secondary | ICD-10-CM | POA: Diagnosis not present

## 2023-06-05 DIAGNOSIS — F330 Major depressive disorder, recurrent, mild: Secondary | ICD-10-CM | POA: Diagnosis not present

## 2023-06-05 DIAGNOSIS — F430 Acute stress reaction: Secondary | ICD-10-CM

## 2023-06-05 MED ORDER — BUPROPION HCL ER (XL) 150 MG PO TB24: 150.0000 mg | ORAL_TABLET | Freq: Every day | ORAL | 1 refills | Status: DC

## 2023-06-05 MED ORDER — BUPROPION HCL ER (XL) 300 MG PO TB24: 300.0000 mg | ORAL_TABLET | Freq: Every day | ORAL | 1 refills | Status: DC

## 2023-06-05 MED ORDER — TRAZODONE HCL 100 MG PO TABS: 100.0000 mg | ORAL_TABLET | Freq: Every evening | ORAL | 1 refills | Status: DC | PRN

## 2023-06-05 MED ORDER — VENLAFAXINE HCL ER 75 MG PO CP24: 225.0000 mg | ORAL_CAPSULE | Freq: Every day | ORAL | 1 refills | Status: DC

## 2023-06-05 NOTE — Progress Notes (Signed)
Jill Pittman [id number] [birth year] 62 y.o.  Virtual Visit via Video Note  I connected with pt @ on 06/05/23 at 11:40 AM EDT by a video enabled telemedicine application and verified that I am speaking with the correct person using two identifiers.   I discussed the limitations of evaluation and management by telemedicine and the availability of in person appointments. The patient expressed understanding and agreed to proceed.  I discussed the assessment and treatment plan with the patient. The patient was provided an opportunity to ask questions and all were answered. The patient agreed with the plan and demonstrated an understanding of the instructions.   The patient was advised to call back or seek an in-person evaluation if the symptoms worsen or if the condition fails to improve as anticipated.  I provided 25 minutes of non-face-to-face time during this encounter.  The patient was located at home.  The provider was located at [facility].   Dorothyann Gibbs, NP   Subjective:   Patient ID:  Jill Pittman is a 62 y.o. (DOB [birth year]) female.  Chief Complaint: No chief complaint on file.   HPI Jill Pittman presents for follow-up of MDD, GAD, insomnia and panic attacks.  Previously seen by Dr. Vanetta Shawl for medication management.   Describes mood today as "ok". Pleasant. Reports some tearfulness. Mood symptoms - reports decreased depression, anxiety and irritability - "more situational". Denies recent panic attacks. Reports worry, rumination, and over thinking. Mood has improved. Stating "I feel like I'm doing ok". Reporting decreased situational stressors. Feels like medications are helpful. Seeing therapist - Elio Forget. Stable interest and motivation. Taking medications as prescribed.  Energy levels "lower". Active, does not have a regular exercise routine.   Enjoys some usual interests and activities. Married. Lives with husband. Has one step son. Friends and family  in the area. Appetite adequate. Weight stable - 143 pounds - 65". Sleeps better some nights than others. Averages 6 hours. Focus and concentration stable. Completing tasks. Managing aspects of household. Works full time - started a new job. Denies SI or HI.  Denies AH or VH. Denies self harm. Denies substance use.  Previous medication trials: Lexapro, fluoxetine, bupropion, quetiapine (fatigue)    Review of Systems:  Review of Systems  Musculoskeletal:  Negative for gait problem.  Neurological:  Negative for tremors.  Psychiatric/Behavioral:         Please refer to HPI    Medications: I have reviewed the patient's current medications.  Current Outpatient Medications  Medication Sig Dispense Refill   buPROPion (WELLBUTRIN XL) 150 MG 24 hr tablet Take 1 tablet (150 mg total) by mouth daily. 90 tablet 1   buPROPion (WELLBUTRIN XL) 300 MG 24 hr tablet Take 1 tablet (300 mg total) by mouth daily. 30 tablet 0   Cholecalciferol (VITAMIN D3) 1.25 MG (50000 UT) CAPS Take 1 capsule (50,000 Units total) by mouth once a week. AFTER the last weekly pill, start a daily OTC Vitamin D3 up to 2,000units. 12 capsule 0   hydrOXYzine (ATARAX) 25 MG tablet Take 1 tablet (25 mg total) by mouth daily as needed. for anxiety 90 tablet 3   levothyroxine (SYNTHROID) 150 MCG tablet TAKE 1 TABLET BY MOUTH DAILY  BEFORE BREAKFAST 90 tablet 3   metFORMIN (GLUCOPHAGE) 500 MG tablet TAKE 1 TABLET BY MOUTH TWICE  DAILY WITH A MEAL 180 tablet 3   REXULTI 1 MG TABS tablet Take 1 tablet by mouth once daily 30 tablet 0   simvastatin (ZOCOR) 10 MG  tablet TAKE 1 TABLET BY MOUTH DAILY 90 tablet 3   traZODone (DESYREL) 100 MG tablet Take 1 tablet (100 mg total) by mouth at bedtime as needed. 90 tablet 3   venlafaxine XR (EFFEXOR-XR) 75 MG 24 hr capsule Take 3 capsules (225 mg total) by mouth daily with breakfast. 90 capsule 0   No current facility-administered medications for this visit.    Medication Side Effects:  None  Allergies:  Allergies  Allergen Reactions   Penicillins Rash    Past Medical History:  Diagnosis Date   History of craniotomy    Hx of resection of meningioma    Hyperlipidemia    Prediabetes     Family History  Problem Relation Age of Onset   Alcohol abuse Mother    Breast cancer Maternal Grandmother    Alcohol abuse Maternal Grandfather     Social History   Socioeconomic History   Marital status: Married    Spouse name: Not on file   Number of children: Not on file   Years of education: Not on file   Highest education level: Not on file  Occupational History   Not on file  Tobacco Use   Smoking status: Never   Smokeless tobacco: Never  Substance and Sexual Activity   Alcohol use: Not on file   Drug use: Not on file   Sexual activity: Not on file  Other Topics Concern   Not on file  Social History Narrative   pt married, moved from  during pandemic, rented RV and travelled all over [place] for 1 year   Social Determinants of Health   Financial Resource Strain: Not on file  Food Insecurity: Not on file  Transportation Needs: Not on file  Physical Activity: Not on file  Stress: Not on file  Social Connections: Not on file  Intimate Partner Violence: Not on file    Past Medical History, Surgical history, Social history, and Family history were reviewed and updated as appropriate.   Please see review of systems for further details on the patient's review from today.   Objective:   Physical Exam:  There were no vitals taken for this visit.  Physical Exam Constitutional:      General: She is not in acute distress. Musculoskeletal:        General: No deformity.  Neurological:     Mental Status: She is alert and oriented to person, place, and time.     Cranial Nerves: No dysarthria.     Coordination: Coordination normal.  Psychiatric:        Attention and Perception: Attention and perception normal. She does not perceive auditory or visual  hallucinations.        Mood and Affect: Mood and affect normal. Affect is not labile, blunt, angry or inappropriate.        Speech: Speech normal.        Behavior: Behavior normal. Behavior is cooperative.        Thought Content: Thought content normal. Thought content is not paranoid or delusional. Thought content does not include homicidal or suicidal ideation. Thought content does not include homicidal or suicidal plan.        Cognition and Memory: Cognition and memory normal.        Judgment: Judgment normal.     Comments: Insight intact     Lab Review:     Component Value Date/Time   NA 138 05/07/2022 0848   K 4.2 05/07/2022 0848   CL 105 05/07/2022  0848   CO2 25 05/07/2022 0848   GLUCOSE 119 (H) 05/07/2022 0848   BUN 13 05/07/2022 0848   CREATININE 0.68 05/07/2022 0848   CALCIUM 8.9 05/07/2022 0848   PROT 7.2 05/07/2022 0848   ALBUMIN 4.3 05/07/2022 0848   AST 17 05/07/2022 0848   ALT 24 05/07/2022 0848   ALKPHOS 89 05/07/2022 0848   BILITOT 0.4 05/07/2022 0848    No results found for: "WBC", "RBC", "HGB", "HCT", "PLT", "MCV", "MCH", "MCHC", "RDW", "LYMPHSABS", "MONOABS", "EOSABS", "BASOSABS"  No results found for: "POCLITH", "LITHIUM"   No results found for: "PHENYTOIN", "PHENOBARB", "VALPROATE", "CBMZ"   .res Assessment: Plan:    Plan:  PDMP reviewed  Rexulti 1mg  tablet daily - leave samples  Buproprion 450 mg daily  Venlafaxine 225 mg daily   Trazodone 100 mg at night as needed for sleep Hydroxyzine 25 mg daily as needed for anxiety   Patient advised to contact office with any questions, adverse effects, or acute worsening in signs and symptoms.  Discussed potential metabolic side effects associated with atypical antipsychotics, as well as potential risk for movement side effects. Advised pt to contact office if movement side effects occur.   RTC 3 months  There are no diagnoses linked to this encounter.   Please see After Visit Summary for patient  specific instructions.  No future appointments.  No orders of the defined types were placed in this encounter.     -------------------------------

## 2023-06-10 ENCOUNTER — Encounter: Payer: Self-pay | Admitting: Family

## 2023-06-28 DIAGNOSIS — Z1231 Encounter for screening mammogram for malignant neoplasm of breast: Secondary | ICD-10-CM | POA: Diagnosis not present

## 2023-06-28 LAB — HM MAMMOGRAPHY

## 2023-07-03 ENCOUNTER — Encounter: Payer: Self-pay | Admitting: Family

## 2023-09-30 ENCOUNTER — Encounter: Payer: Self-pay | Admitting: Family

## 2023-10-10 ENCOUNTER — Other Ambulatory Visit: Payer: Self-pay | Admitting: Adult Health

## 2023-10-10 DIAGNOSIS — F330 Major depressive disorder, recurrent, mild: Secondary | ICD-10-CM

## 2023-10-21 ENCOUNTER — Encounter: Payer: Self-pay | Admitting: Family

## 2023-10-21 NOTE — Telephone Encounter (Signed)
 She would call any orthopedic office. If they need a referral she needs to come in to see me first. Thx.

## 2023-10-24 ENCOUNTER — Telehealth: Payer: Self-pay | Admitting: Gastroenterology

## 2023-10-24 NOTE — Telephone Encounter (Signed)
 Good Afternoon Dr Milana Kidney   Doctor Of The Day   Patient called requesting to transfer care over to our practice to have his colon procedure done.   Previous procedure was with [facility]. Records are available in epic for review. Please review and advise on scheduling.   Thank you

## 2023-11-03 ENCOUNTER — Other Ambulatory Visit: Payer: Self-pay | Admitting: Adult Health

## 2023-11-03 DIAGNOSIS — F330 Major depressive disorder, recurrent, mild: Secondary | ICD-10-CM

## 2023-11-06 ENCOUNTER — Other Ambulatory Visit: Payer: Self-pay | Admitting: Adult Health

## 2023-11-06 DIAGNOSIS — F330 Major depressive disorder, recurrent, mild: Secondary | ICD-10-CM

## 2023-11-06 NOTE — Telephone Encounter (Signed)
 Called patient to further advise on previous recommendations.

## 2023-11-08 NOTE — Telephone Encounter (Signed)
 Patient will reach Dr Eugene Garnet office for further review.

## 2023-11-12 ENCOUNTER — Ambulatory Visit: Admitting: Sports Medicine

## 2023-11-12 ENCOUNTER — Ambulatory Visit (INDEPENDENT_AMBULATORY_CARE_PROVIDER_SITE_OTHER): Payer: Self-pay

## 2023-11-12 ENCOUNTER — Ambulatory Visit: Payer: Self-pay | Admitting: Orthopedic Surgery

## 2023-11-12 ENCOUNTER — Other Ambulatory Visit: Payer: Self-pay

## 2023-11-12 DIAGNOSIS — M25551 Pain in right hip: Secondary | ICD-10-CM

## 2023-11-12 DIAGNOSIS — M5441 Lumbago with sciatica, right side: Secondary | ICD-10-CM

## 2023-11-12 DIAGNOSIS — G8929 Other chronic pain: Secondary | ICD-10-CM

## 2023-11-12 DIAGNOSIS — M1611 Unilateral primary osteoarthritis, right hip: Secondary | ICD-10-CM | POA: Diagnosis not present

## 2023-11-12 MED ORDER — METHYLPREDNISOLONE ACETATE 40 MG/ML IJ SUSP
80.0000 mg | INTRAMUSCULAR | Status: AC | PRN
  Administered 2023-11-12: 80 mg via INTRA_ARTICULAR

## 2023-11-12 MED ORDER — LIDOCAINE HCL 1 % IJ SOLN
4.0000 mL | INTRAMUSCULAR | Status: AC | PRN
  Administered 2023-11-12: 4 mL

## 2023-11-12 NOTE — Progress Notes (Signed)
   Procedure Note  Patient: Jill Pittman             Date of Birth: Dec 22, 1960           MRN: [id number]             Visit Date: 11/12/2023  Procedures: Visit Diagnoses:  1. Pain in right hip   2. Unilateral primary osteoarthritis, right hip    Large Joint Inj: R hip joint on 11/12/2023 3:36 PM Indications: pain Details: 22 G 3.5 in needle, ultrasound-guided anterior approach Medications: 4 mL lidocaine 1 %; 80 mg methylPREDNISolone acetate 40 MG/ML Outcome: tolerated well, no immediate complications  Procedure: US-guided intra-articular hip injection, right After discussion on risks/benefits/indications and informed verbal consent was obtained, a timeout was performed. Patient was lying supine on exam table. The hip was cleaned with betadine and alcohol swabs. Then utilizing ultrasound guidance, the patient's femoral head and neck junction was identified and subsequently injected with 4:2 lidocaine:depomedrol via an in-plane approach with ultrasound visualization of the injectate administered into the hip joint. Patient tolerated procedure well without immediate complications.  Procedure, treatment alternatives, risks and benefits explained, specific risks discussed. Consent was given by the patient. Immediately prior to procedure a time out was called to verify the correct patient, procedure, equipment, support staff and site/side marked as required. Patient was prepped and draped in the usual sterile fashion.     - post injection protocol discussed - f/u with Dr. Lajoyce Corners as indicated; I am happy to see her as needed  Madelyn Brunner, DO Primary Care Sports Medicine Physician  [facility] - Orthopedics  This note was dictated using Dragon naturally speaking software and may contain errors in syntax, spelling, or content which have not been identified prior to signing this note.

## 2023-11-13 ENCOUNTER — Encounter: Payer: Self-pay | Admitting: Orthopedic Surgery

## 2023-11-13 NOTE — Progress Notes (Signed)
 Office Visit Note   Patient: Jill Pittman           Date of Birth: 1961/05/15           MRN: [id number] Visit Date: 11/12/2023              Requested by: Dulce Sellar, NP [address] [place],  [place] [address] PCP: Dulce Sellar, NP  Chief Complaint  Patient presents with   Lower Back - Pain      HPI: Patient is a 63 year old woman who is seen for initial evaluation for right lateral hip and groin pain.  Patient states this started after walking long distances.  She states it is now getting worse.  She states the pain starts posterior lateral buttocks on the right and goes anteriorly towards the groin.  She has tried anti-inflammatories without relief.  She states that ice made it worse.  Assessment & Plan: Visit Diagnoses:  1. Chronic right-sided low back pain with right-sided sciatica   2. Unilateral primary osteoarthritis, right hip     Plan: Will have patient see Dr. Shon Baton for evaluation for a injection in the right hip.  Discussed that if she does not obtain sufficient relief with a intra-articular injection she may be a candidate for total hip arthroplasty.  Would have her follow-up with Dr. Magnus Ivan if surgical intervention needed.  Follow-Up Instructions: Return if symptoms worsen or fail to improve.   Ortho Exam  Patient is alert, oriented, no adenopathy, well-dressed, normal affect, normal respiratory effort. Examination patient has a slight abductor lurch.  She has a negative straight leg raise no focal motor weakness.  Pain is reproduced with internal and external rotation of the right hip with internal rotation of 10 degrees and external rotation of 45 degrees.  Imaging: No results found. No images are attached to the encounter.  Labs: Lab Results  Component Value Date   HGBA1C 6.3 07/06/2022     Lab Results  Component Value Date   ALBUMIN 4.3 05/07/2022    No results found for: "MG" Lab Results  Component Value Date   VD25OH  14.90 (L) 11/05/2022    No results found for: "PREALBUMIN"     No data to display           There is no height or weight on file to calculate BMI.  Orders:  Orders Placed This Encounter  Procedures   XR HIP UNILAT W OR W/O PELVIS 2-3 VIEWS RIGHT   XR Lumbar Spine 2-3 Views   No orders of the defined types were placed in this encounter.    Procedures: No procedures performed  Clinical Data: No additional findings.  ROS:  All other systems negative, except as noted in the HPI. Review of Systems  Objective: Vital Signs: There were no vitals taken for this visit.  Specialty Comments:  No specialty comments available.  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 No pertinent surgical history. Social History   Occupational History   Not on file  Tobacco Use   Smoking status: Never   Smokeless tobacco: Never  Substance and Sexual Activity   Alcohol use: Not on file   Drug use: Not on file   Sexual activity: Not on file

## 2023-11-21 ENCOUNTER — Other Ambulatory Visit: Payer: Self-pay | Admitting: Family

## 2023-11-21 DIAGNOSIS — E039 Hypothyroidism, unspecified: Secondary | ICD-10-CM

## 2023-12-01 ENCOUNTER — Other Ambulatory Visit: Payer: Self-pay | Admitting: Adult Health

## 2023-12-01 DIAGNOSIS — G4700 Insomnia, unspecified: Secondary | ICD-10-CM

## 2023-12-06 ENCOUNTER — Telehealth: Payer: Self-pay | Admitting: Adult Health

## 2023-12-06 DIAGNOSIS — G4700 Insomnia, unspecified: Secondary | ICD-10-CM

## 2023-12-06 MED ORDER — TRAZODONE HCL 100 MG PO TABS: 100.0000 mg | ORAL_TABLET | Freq: Every evening | ORAL | 0 refills | Status: DC | PRN

## 2023-12-06 NOTE — Telephone Encounter (Signed)
 Next appt is 12/10/23. Requesting refill on Trazodone  called to:  [facility] [address], [place] - [address]   Phone: [phone]  Fax: [phone]

## 2023-12-06 NOTE — Telephone Encounter (Signed)
 Sent 30-day RF of trazodone . Patient has not been compliant with FU.

## 2023-12-10 ENCOUNTER — Encounter: Payer: Self-pay | Admitting: Adult Health

## 2023-12-10 ENCOUNTER — Telehealth (INDEPENDENT_AMBULATORY_CARE_PROVIDER_SITE_OTHER): Payer: Self-pay | Admitting: Adult Health

## 2023-12-10 ENCOUNTER — Ambulatory Visit: Payer: Self-pay | Admitting: Adult Health

## 2023-12-10 DIAGNOSIS — F410 Panic disorder [episodic paroxysmal anxiety] without agoraphobia: Secondary | ICD-10-CM

## 2023-12-10 DIAGNOSIS — G4700 Insomnia, unspecified: Secondary | ICD-10-CM | POA: Diagnosis not present

## 2023-12-10 DIAGNOSIS — F330 Major depressive disorder, recurrent, mild: Secondary | ICD-10-CM | POA: Diagnosis not present

## 2023-12-10 DIAGNOSIS — F411 Generalized anxiety disorder: Secondary | ICD-10-CM

## 2023-12-10 DIAGNOSIS — F430 Acute stress reaction: Secondary | ICD-10-CM

## 2023-12-10 MED ORDER — BUPROPION HCL ER (XL) 300 MG PO TB24: 300.0000 mg | ORAL_TABLET | Freq: Every day | ORAL | 1 refills | Status: DC

## 2023-12-10 MED ORDER — BUPROPION HCL ER (XL) 150 MG PO TB24: 150.0000 mg | ORAL_TABLET | Freq: Every day | ORAL | 1 refills | Status: DC

## 2023-12-10 MED ORDER — BREXPIPRAZOLE 1 MG PO TABS
1.0000 mg | ORAL_TABLET | Freq: Every day | ORAL | 2 refills | Status: DC
Start: 2023-12-10 — End: 2024-03-02

## 2023-12-10 MED ORDER — HYDROXYZINE HCL 25 MG PO TABS: 25.0000 mg | ORAL_TABLET | Freq: Every day | ORAL | 1 refills | Status: DC | PRN

## 2023-12-10 MED ORDER — TRAZODONE HCL 100 MG PO TABS: 100.0000 mg | ORAL_TABLET | Freq: Every evening | ORAL | 1 refills | Status: DC | PRN

## 2023-12-10 MED ORDER — VENLAFAXINE HCL ER 75 MG PO CP24: 225.0000 mg | ORAL_CAPSULE | Freq: Every day | ORAL | 1 refills | Status: DC

## 2023-12-10 NOTE — Progress Notes (Signed)
 Jill Pittman [id number] 10-10-1960 63 y.o.  Virtual Visit via Video Note  I connected with pt @ on 12/10/23 at  3:00 PM EDT by a video enabled telemedicine application and verified that I am speaking with the correct person using two identifiers.   I discussed the limitations of evaluation and management by telemedicine and the availability of in person appointments. The patient expressed understanding and agreed to proceed.  I discussed the assessment and treatment plan with the patient. The patient was provided an opportunity to ask questions and all were answered. The patient agreed with the plan and demonstrated an understanding of the instructions.   The patient was advised to call back or seek an in-person evaluation if the symptoms worsen or if the condition fails to improve as anticipated.  I provided 25 minutes of non-face-to-face time during this encounter.  The patient was located at home.  The provider was located at [facility].  Reagan Camera, NP   Subjective:   Patient ID:  Jill Pittman is a 63 y.o. (DOB 11/06/1960) female.  Chief Complaint: No chief complaint on file.   HPI Adreonna Hinck presents for follow-up of MDD, GAD, insomnia and panic attacks.  Previously seen by Dr. Hisada for medication management.   Describes mood today as "ok". Pleasant. Denies tearfulness. Mood symptoms - reports decreased depression and anxiety Reports irritability at times. Denies recent panic attacks. Reports worry, rumination and over thinking - "always". Reports mood as stable. Stating "I feel like I'm doing ok". Feels like medications are helpful. Taking medications as prescribed.  Energy levels stable. Active, does not have a regular exercise routine.   Enjoys some usual interests and activities. Married. Lives with husband. Has one step son. Friends and family in the area. Appetite adequate. Weight loss - 138 from 143 pounds - 65". Sleeps well most nights.  Averages 6 hours. Focus and concentration has improved. Completing tasks. Managing aspects of household. Works full time. Denies SI or HI.  Denies AH or VH. Denies self harm. Denies substance use.  Previous medication trials: Lexapro, fluoxetine, bupropion , quetiapine  (fatigue)  Review of Systems:  Review of Systems  Musculoskeletal:  Negative for gait problem.  Neurological:  Negative for tremors.  Psychiatric/Behavioral:         Please refer to HPI    Medications: I have reviewed the patient's current medications.  Current Outpatient Medications  Medication Sig Dispense Refill   brexpiprazole  (REXULTI ) 1 MG TABS tablet Take 1 tablet (1 mg total) by mouth daily. 30 tablet 2   buPROPion  (WELLBUTRIN  XL) 150 MG 24 hr tablet Take 1 tablet (150 mg total) by mouth daily. 90 tablet 1   buPROPion  (WELLBUTRIN  XL) 300 MG 24 hr tablet Take 1 tablet (300 mg total) by mouth daily. 90 tablet 1   Cholecalciferol (VITAMIN D3) 1.25 MG (50000 UT) CAPS Take 1 capsule (50,000 Units total) by mouth once a week. AFTER the last weekly pill, start a daily OTC Vitamin D3 up to 2,000units. 12 capsule 0   hydrOXYzine  (ATARAX ) 25 MG tablet Take 1 tablet (25 mg total) by mouth daily as needed. for anxiety 90 tablet 1   levothyroxine  (SYNTHROID ) 150 MCG tablet TAKE 1 TABLET BY MOUTH DAILY  BEFORE BREAKFAST 90 tablet 3   metFORMIN  (GLUCOPHAGE ) 500 MG tablet TAKE 1 TABLET BY MOUTH TWICE  DAILY WITH A MEAL 180 tablet 3   simvastatin  (ZOCOR ) 10 MG tablet TAKE 1 TABLET BY MOUTH DAILY 90 tablet 3   traZODone  (DESYREL )  100 MG tablet Take 1 tablet (100 mg total) by mouth at bedtime as needed. 90 tablet 1   venlafaxine  XR (EFFEXOR -XR) 75 MG 24 hr capsule Take 3 capsules (225 mg total) by mouth daily with breakfast. 270 capsule 1   No current facility-administered medications for this visit.    Medication Side Effects: None  Allergies:  Allergies  Allergen Reactions   Penicillins Rash    Past Medical History:   Diagnosis Date   History of craniotomy    Hx of resection of meningioma    Hyperlipidemia    Prediabetes     Family History  Problem Relation Age of Onset   Alcohol abuse Mother    Breast cancer Maternal Grandmother    Alcohol abuse Maternal Grandfather     Social History   Socioeconomic History   Marital status: Married    Spouse name: Not on file   Number of children: Not on file   Years of education: Not on file   Highest education level: Not on file  Occupational History   Not on file  Tobacco Use   Smoking status: Never   Smokeless tobacco: Never  Substance and Sexual Activity   Alcohol use: Not on file   Drug use: Not on file   Sexual activity: Not on file  Other Topics Concern   Not on file  Social History Narrative   pt married, moved from [place] during pandemic, rented RV and travelled all over [place]  for 1 year   Social Drivers of Corporate investment banker Strain: Not on file  Food Insecurity: Not on file  Transportation Needs: Not on file  Physical Activity: Not on file  Stress: Not on file  Social Connections: Not on file  Intimate Partner Violence: Not on file    Past Medical History, Surgical history, Social history, and Family history were reviewed and updated as appropriate.   Please see review of systems for further details on the patient's review from today.   Objective:   Physical Exam:  There were no vitals taken for this visit.  Physical Exam  Lab Review:     Component Value Date/Time   NA 138 05/07/2022 0848   K 4.2 05/07/2022 0848   CL 105 05/07/2022 0848   CO2 25 05/07/2022 0848   GLUCOSE 119 (H) 05/07/2022 0848   BUN 13 05/07/2022 0848   CREATININE 0.68 05/07/2022 0848   CALCIUM 8.9 05/07/2022 0848   PROT 7.2 05/07/2022 0848   ALBUMIN 4.3 05/07/2022 0848   AST 17 05/07/2022 0848   ALT 24 05/07/2022 0848   ALKPHOS 89 05/07/2022 0848   BILITOT 0.4 05/07/2022 0848    No results found for: "WBC", "RBC", "HGB", "HCT", "PLT",  "MCV", "MCH", "MCHC", "RDW", "LYMPHSABS", "MONOABS", "EOSABS", "BASOSABS"  No results found for: "POCLITH", "LITHIUM"   No results found for: "PHENYTOIN", "PHENOBARB", "VALPROATE", "CBMZ"   .res Assessment: Plan:    Plan:  PDMP reviewed  Rexulti  1mg  tablet daily   Buproprion 450mg  daily 300/150mg  Venlafaxine  225mg  daily   Trazodone  100mg  at night as needed for sleep Hydroxyzine  25mg  daily as needed for anxiety   RTC 3 months  25 minutes spent dedicated to the care of this patient on the date of this encounter to include pre-visit review of records, ordering of medication, post visit documentation, and face-to-face time with the patient discussing MDD, GAD, insomnia and panic attacks.  Patient advised to contact office with any questions, adverse effects, or acute worsening in signs  and symptoms.  Discussed potential metabolic side effects associated with atypical antipsychotics, as well as potential risk for movement side effects. Advised pt to contact office if movement side effects occur.   Discussed continuing current medication regimen.  Diagnoses and all orders for this visit:  MDD (major depressive disorder), recurrent episode, mild (HCC) -     buPROPion  (WELLBUTRIN  XL) 150 MG 24 hr tablet; Take 1 tablet (150 mg total) by mouth daily. -     buPROPion  (WELLBUTRIN  XL) 300 MG 24 hr tablet; Take 1 tablet (300 mg total) by mouth daily. -     brexpiprazole  (REXULTI ) 1 MG TABS tablet; Take 1 tablet (1 mg total) by mouth daily.  GAD (generalized anxiety disorder) -     buPROPion  (WELLBUTRIN  XL) 300 MG 24 hr tablet; Take 1 tablet (300 mg total) by mouth daily. -     hydrOXYzine  (ATARAX ) 25 MG tablet; Take 1 tablet (25 mg total) by mouth daily as needed. for anxiety  Insomnia, unspecified type -     traZODone  (DESYREL ) 100 MG tablet; Take 1 tablet (100 mg total) by mouth at bedtime as needed.  Panic attack as reaction to stress -     hydrOXYzine  (ATARAX ) 25 MG tablet; Take 1  tablet (25 mg total) by mouth daily as needed. for anxiety  Other orders -     venlafaxine  XR (EFFEXOR -XR) 75 MG 24 hr capsule; Take 3 capsules (225 mg total) by mouth daily with breakfast.     Please see After Visit Summary for patient specific instructions.  No future appointments.   No orders of the defined types were placed in this encounter.     -------------------------------

## 2023-12-11 ENCOUNTER — Other Ambulatory Visit: Payer: Self-pay | Admitting: Adult Health

## 2023-12-11 DIAGNOSIS — F330 Major depressive disorder, recurrent, mild: Secondary | ICD-10-CM

## 2023-12-11 DIAGNOSIS — F411 Generalized anxiety disorder: Secondary | ICD-10-CM

## 2024-01-16 ENCOUNTER — Other Ambulatory Visit: Payer: Self-pay | Admitting: Adult Health

## 2024-01-17 NOTE — Telephone Encounter (Signed)
 Last RF sent to [facility]. ? Pharmacy.

## 2024-01-17 NOTE — Telephone Encounter (Signed)
 Sent MyChart message asking which pharmacy is correct.

## 2024-01-24 ENCOUNTER — Other Ambulatory Visit: Payer: Self-pay | Admitting: Family

## 2024-01-24 DIAGNOSIS — E039 Hypothyroidism, unspecified: Secondary | ICD-10-CM

## 2024-01-27 NOTE — Telephone Encounter (Signed)
 Patient due for annual; Left VM to call office to make appt.

## 2024-01-29 ENCOUNTER — Encounter: Payer: Self-pay | Admitting: Family

## 2024-01-29 ENCOUNTER — Other Ambulatory Visit: Payer: Self-pay

## 2024-01-29 DIAGNOSIS — E039 Hypothyroidism, unspecified: Secondary | ICD-10-CM

## 2024-01-31 ENCOUNTER — Other Ambulatory Visit: Payer: Self-pay

## 2024-01-31 DIAGNOSIS — E039 Hypothyroidism, unspecified: Secondary | ICD-10-CM

## 2024-01-31 MED ORDER — LEVOTHYROXINE SODIUM 150 MCG PO TABS: 150.0000 ug | ORAL_TABLET | Freq: Every day | ORAL | 0 refills | Status: DC

## 2024-02-04 ENCOUNTER — Encounter: Payer: Self-pay | Admitting: Family

## 2024-02-04 ENCOUNTER — Telehealth: Payer: Self-pay | Admitting: Family

## 2024-02-04 ENCOUNTER — Ambulatory Visit (INDEPENDENT_AMBULATORY_CARE_PROVIDER_SITE_OTHER): Admitting: Family

## 2024-02-04 ENCOUNTER — Other Ambulatory Visit (HOSPITAL_COMMUNITY)
Admission: RE | Admit: 2024-02-04 | Discharge: 2024-02-04 | Disposition: A | Discharge status: Home / Self Care | Source: Ambulatory Visit | Attending: Family | Admitting: Family

## 2024-02-04 VITALS — BP 122/77 | HR 84 | Wt 139.4 lb

## 2024-02-04 DIAGNOSIS — Z23 Encounter for immunization: Secondary | ICD-10-CM

## 2024-02-04 DIAGNOSIS — Z113 Encounter for screening for infections with a predominantly sexual mode of transmission: Secondary | ICD-10-CM | POA: Diagnosis not present

## 2024-02-04 DIAGNOSIS — E782 Mixed hyperlipidemia: Secondary | ICD-10-CM

## 2024-02-04 DIAGNOSIS — Z1151 Encounter for screening for human papillomavirus (HPV): Secondary | ICD-10-CM

## 2024-02-04 DIAGNOSIS — E039 Hypothyroidism, unspecified: Secondary | ICD-10-CM | POA: Diagnosis not present

## 2024-02-04 DIAGNOSIS — Z0000 Encounter for general adult medical examination without abnormal findings: Secondary | ICD-10-CM

## 2024-02-04 DIAGNOSIS — M25551 Pain in right hip: Secondary | ICD-10-CM | POA: Insufficient documentation

## 2024-02-04 DIAGNOSIS — R7303 Prediabetes: Secondary | ICD-10-CM | POA: Diagnosis not present

## 2024-02-04 LAB — LIPID PANEL
Cholesterol: 197 mg/dL (ref 0–200)
HDL: 62.5 mg/dL (ref >39.00)
LDL Cholesterol: 94 mg/dL (ref 0–99)
NonHDL: 134.03
Total CHOL/HDL Ratio: 3
Triglycerides: 199 mg/dL — ABNORMAL HIGH (ref 0.0–149.0)
VLDL: 39.8 mg/dL (ref 0.0–40.0)

## 2024-02-04 LAB — CBC WITH DIFFERENTIAL/PLATELET
Basophils Absolute: 0 10*3/uL (ref 0.0–0.1)
Basophils Relative: 0.7 % (ref 0.0–3.0)
Eosinophils Absolute: 0.3 10*3/uL (ref 0.0–0.7)
Eosinophils Relative: 4.9 % (ref 0.0–5.0)
HCT: 43.1 % (ref 36.0–46.0)
Hemoglobin: 14.1 g/dL (ref 12.0–15.0)
Lymphocytes Relative: 38.7 % (ref 12.0–46.0)
Lymphs Abs: 2.6 10*3/uL (ref 0.7–4.0)
MCHC: 32.7 g/dL (ref 30.0–36.0)
MCV: 87.4 fl (ref 78.0–100.0)
Monocytes Absolute: 0.3 10*3/uL (ref 0.1–1.0)
Monocytes Relative: 4.7 % (ref 3.0–12.0)
Neutro Abs: 3.5 10*3/uL (ref 1.4–7.7)
Neutrophils Relative %: 51 % (ref 43.0–77.0)
Platelets: 297 10*3/uL (ref 150.0–400.0)
RBC: 4.93 Mil/uL (ref 3.87–5.11)
RDW: 13.2 % (ref 11.5–15.5)
WBC: 6.8 10*3/uL (ref 4.0–10.5)

## 2024-02-04 LAB — COMPREHENSIVE METABOLIC PANEL WITH GFR
ALT: 29 U/L (ref 0–35)
AST: 18 U/L (ref 0–37)
Albumin: 4.4 g/dL (ref 3.5–5.2)
Alkaline Phosphatase: 89 U/L (ref 39–117)
BUN: 10 mg/dL (ref 6–23)
CO2: 29 meq/L (ref 19–32)
Calcium: 9.5 mg/dL (ref 8.4–10.5)
Chloride: 103 meq/L (ref 96–112)
Creatinine, Ser: 0.65 mg/dL (ref 0.40–1.20)
GFR: 95.19 mL/min (ref >60.00)
Glucose, Bld: 138 mg/dL — ABNORMAL HIGH (ref 70–99)
Potassium: 4.4 meq/L (ref 3.5–5.1)
Sodium: 139 meq/L (ref 135–145)
Total Bilirubin: 0.4 mg/dL (ref 0.2–1.2)
Total Protein: 7.6 g/dL (ref 6.0–8.3)

## 2024-02-04 LAB — T4, FREE: Free T4: 1.01 ng/dL (ref 0.60–1.60)

## 2024-02-04 LAB — TSH: TSH: 0.01 u[IU]/mL — ABNORMAL LOW (ref 0.35–5.50)

## 2024-02-04 MED ORDER — SIMVASTATIN 10 MG PO TABS: 10.0000 mg | ORAL_TABLET | Freq: Every day | ORAL | 3 refills | Status: AC

## 2024-02-04 NOTE — Assessment & Plan Note (Addendum)
 Hip pain due to osteoarthritis persists despite previous cortisone injection. Advised regular exercise to prevent stiffness. Suggested naproxen for longer-acting pain relief with fewer gastrointestinal side effects. - Recommend regular exercise to prevent stiffness. - Consider using naproxen for pain management.

## 2024-02-04 NOTE — Telephone Encounter (Unsigned)
 Copied from CRM [id number]. Topic: Clinical - Lab/Test Results >> Feb 04, 2024  4:22 PM Tiffini S wrote: Reason for CRM: Marylu with labs at [phone] called asking if a pap smear was completed today during the patient's appointment that we can  do  testing for HPV, HPV [phone] we will do the Cervicovaginal swab for  chlamydia, gonorrhea and  bvcv labs

## 2024-02-04 NOTE — Patient Instructions (Signed)
 It was very nice to see you today!   I will review your lab results via MyChart in a few days.  Schedule a 2 or 3 month follow up for your second shingles vaccine and to recheck your thyroid .  Have a great summer!      PLEASE NOTE:  If you had any lab tests please let us  know if you have not heard back within a few days. You may see your results on MyChart before we have a chance to review them but we will give you a call once they are reviewed by us . If we ordered any referrals today, please let us  know if you have not heard from their office within the next week.

## 2024-02-04 NOTE — Progress Notes (Signed)
 Phone [phone]  Subjective:   Patient is a 63 y.o. female presenting for annual physical.    Chief Complaint  Patient presents with   Annual Exam    Pt is fasting  Discussed the use of AI scribe software for clinical note transcription with the patient, who gave verbal consent to proceed.  History of Present Illness Jill Pittman is a 63 year old female with arthritis and thyroid  issues who presents for a follow-up visit.  Hip arthralgia - Chronic hip pain attributed to arthritis, confirmed by X-ray - Received a cortisone injection 1-2 months ago with initial relief - Persistent morning soreness - Intermittent use of Motrin for pain management  Hyperlipidemia:  -Patient is currently maintained on the following medication for hyperlipidemia: Zocor  10mg  qd. Patient denies myalgia or other side effects. -Patient reports good compliance with low fat/low cholesterol diet.   Thyroid  dysfunction symptoms - Takes levothyroxine  150 micrograms daily - Frequent sensation of feeling hot - Occasional heart palpitations, more noticeable over the past month - Previous thyroid  levels borderline high - No loose bowels - Reports some increased anxiety  Glycemic control and weight change - Takes metformin  for borderline diabetes - Decrease in weight, considered beneficial  See problem oriented charting- ROS- full  review of systems was completed and negative except for: what is noted in HPI above.  The following were reviewed and entered/updated in epic: Past Medical History:  Diagnosis Date   History of craniotomy    Hx of resection of meningioma    Hyperlipidemia    Prediabetes    Patient Active Problem List   Diagnosis Date Noted   Milia 05/07/2022   Generalized anxiety disorder 05/07/2022   Acquired hypothyroidism 05/06/2022   History of craniotomy 11/12/2021   Postmenopausal estrogen deficiency 11/06/2021   Prediabetes    Hyperlipidemia    Moderate episode of  recurrent major depressive disorder (HCC) 06/16/2019   CSF leak 04/16/2019   History reviewed. No pertinent surgical history.  Family History  Problem Relation Age of Onset   Alcohol abuse Mother    Breast cancer Maternal Grandmother    Alcohol abuse Maternal Grandfather     Medications- reviewed and updated Current Outpatient Medications  Medication Sig Dispense Refill   brexpiprazole  (REXULTI ) 1 MG TABS tablet Take 1 tablet (1 mg total) by mouth daily. 30 tablet 2   buPROPion  (WELLBUTRIN  XL) 150 MG 24 hr tablet Take 1 tablet (150 mg total) by mouth daily. 90 tablet 1   buPROPion  (WELLBUTRIN  XL) 300 MG 24 hr tablet Take 1 tablet (300 mg total) by mouth daily. 90 tablet 1   Cholecalciferol (VITAMIN D3) 1.25 MG (50000 UT) CAPS Take 1 capsule (50,000 Units total) by mouth once a week. AFTER the last weekly pill, start a daily OTC Vitamin D3 up to 2,000units. 12 capsule 0   hydrOXYzine  (ATARAX ) 25 MG tablet Take 1 tablet (25 mg total) by mouth daily as needed. for anxiety 90 tablet 1   levothyroxine  (SYNTHROID ) 150 MCG tablet Take 1 tablet (150 mcg total) by mouth daily before breakfast. 90 tablet 0   metFORMIN  (GLUCOPHAGE ) 500 MG tablet TAKE 1 TABLET BY MOUTH TWICE  DAILY WITH A MEAL 180 tablet 3   simvastatin  (ZOCOR ) 10 MG tablet TAKE 1 TABLET BY MOUTH DAILY 90 tablet 3   traZODone  (DESYREL ) 100 MG tablet Take 1 tablet (100 mg total) by mouth at bedtime as needed. 90 tablet 1   venlafaxine  XR (EFFEXOR -XR) 75 MG 24 hr capsule Take  3 capsules by mouth once daily with breakfast. 270 capsule 0   No current facility-administered medications for this visit.   Allergies-reviewed and updated Allergies  Allergen Reactions   Penicillins Rash   Social History   Social History Narrative   pt married, moved from [place] during pandemic, rented RV and travelled all over [place]  for 1 year    Objective:  BP 122/77   Pulse 84   Wt 139 lb 6.4 oz (63.2 kg)   SpO2 95%   BMI 23.20 kg/m  Physical  Exam Vitals and nursing note reviewed.  Constitutional:      Appearance: Normal appearance.  HENT:     Head: Normocephalic.     Right Ear: Tympanic membrane normal.     Left Ear: Tympanic membrane normal.     Nose: Nose normal.     Mouth/Throat:     Mouth: Mucous membranes are moist.   Eyes:     Pupils: Pupils are equal, round, and reactive to light.    Cardiovascular:     Rate and Rhythm: Normal rate and regular rhythm.  Pulmonary:     Effort: Pulmonary effort is normal.     Breath sounds: Normal breath sounds.   Musculoskeletal:        General: Normal range of motion.     Cervical back: Normal range of motion.  Lymphadenopathy:     Cervical: No cervical adenopathy.   Skin:    General: Skin is warm and dry.   Neurological:     Mental Status: She is alert.   Psychiatric:        Mood and Affect: Mood normal.        Behavior: Behavior normal.    Assessment and Plan   Health Maintenance counseling: 1. Anticipatory guidance: Patient counseled regarding regular dental exams q6 months, eye exams,  avoiding smoking and second hand smoke, limiting alcohol to 1 beverage per day, no illicit drugs.   2. Risk factor reduction:  Advised patient of need for regular exercise and diet rich with fruits and vegetables to reduce risk of heart attack and stroke. Exercise- minimal.  Wt Readings from Last 3 Encounters:  02/04/24 139 lb 6.4 oz (63.2 kg)  11/05/22 143 lb 6.4 oz (65 kg)  05/07/22 139 lb 12.8 oz (63.4 kg)   3. Immunizations/screenings/ancillary studies Immunization History  Administered Date(s) Administered   Janssen (J&J) SARS-COV-2 Vaccination 11/13/2019   Moderna SARS-COV2 Booster Vaccination 07/07/2020, 04/28/2021   Pneumococcal Conjugate-13 10/14/2018   Pneumococcal Polysaccharide-23 12/09/2018   Tdap 12/09/2014   Health Maintenance Due  Topic Date Due   HIV Screening  Never done   Cervical Cancer Screening (HPV/Pap Cotest)  Never done   Zoster Vaccines-  Shingrix (1 of 2) Never done   Pneumococcal Vaccine 68-40 Years old (3 of 3 - PCV20 or PCV21) 12/09/2023    4. Cervical cancer screening-  overdue, will check HPV today 5. Breast cancer screening-  mammogram up to date 6. Colon cancer screening - scheduled in october 7. Skin cancer screening- advised regular sunscreen use. Denies worrisome, changing, or new skin lesions.  8. Birth control/STD check- N/A  9. Osteoporosis screening- done 2 years ago and normal  10. Alcohol screening: 2-3 glasses per week 11. Smoking associated screening (lung cancer screening, AAA screen 65-75, UA)- never- smoked  Assessment & Plan Hip Osteoarthritis Hip pain due to osteoarthritis persists despite previous cortisone injection. Advised regular exercise to prevent stiffness. Suggested naproxen for longer-acting pain relief with fewer gastrointestinal  side effects. - Recommend regular exercise to prevent stiffness. - Consider using naproxen for pain management.  Thyroid  Dysfunction Borderline high thyroid  levels with symptoms suggesting current levothyroxine  dose may be high. Proposed dose reduction to alleviate symptoms.  - Check thyroid  levels today. - Consider reducing levothyroxine  dose to 125mcg micrograms. - Recheck thyroid  levels in 2-3 months if dose is adjusted.  Hyperlipidemia  chronic taking zocor  10mg  qd last lipids wnl, rechecking today f/u in 6 mos  Borderline Diabetes Borderline diabetes managed with metformin  500mg  qd. Plan to assess diabetic status with A1c level. Weight loss may improve glucose levels. Last A1C 07/2022 6.3. - Check A1c level today  General Health Maintenance Due for routine health screenings. Informed about Pap smear importance and suggested self-administered HPV swab. Scheduled colonoscopy in October. Advised on hydration and effects of caffeine and carbonation on bone health. - Administer Shingles vaccine #1 today. - Perform self-administered HPV swab test. -  Schedule DEXA scan for next year. - Encourage healthy diet with lean protein and veges, increased cardio exercise. - Encourage adequate hydration with at least 2-2.5 liters of water daily. - Advise on limiting caffeine and carbonation intake.   Recommended follow up:  Return in about 2 months (around 04/06/2024) for 2 or 3 mos for 2nd shingles AND see me for thyroid . No future appointments.  Lab/Order associations: fasting   Lucius Krabbe, NP

## 2024-02-04 NOTE — Assessment & Plan Note (Signed)
 Borderline high thyroid  levels with symptoms suggesting current levothyroxine  dose may be high. Proposed dose reduction to alleviate symptoms.  - Check thyroid  levels today. - Consider reducing levothyroxine  dose to 125mcg micrograms. - Recheck thyroid  levels in 2-3 months if dose is adjusted.

## 2024-02-04 NOTE — Assessment & Plan Note (Signed)
 Borderline diabetes managed with metformin  500mg  qd. Plan to assess diabetic status with A1c level. Weight loss may improve glucose levels. Last A1C 07/2022 6.3. - Check A1c level today

## 2024-02-04 NOTE — Assessment & Plan Note (Addendum)
 chronic taking zocor  10mg  qd, sending refill last lipids wnl in 2023, rechecking today f/u in 6 mos

## 2024-02-06 LAB — HIV ANTIBODY (ROUTINE TESTING W REFLEX): HIV 1&2 Ab, 4th Generation: NONREACTIVE

## 2024-02-06 LAB — CERVICOVAGINAL ANCILLARY ONLY
Bacterial Vaginitis (gardnerella): NEGATIVE
Candida Glabrata: NEGATIVE
Candida Vaginitis: NEGATIVE
Chlamydia: NEGATIVE
Comment: NEGATIVE
Comment: NEGATIVE
Comment: NEGATIVE
Comment: NEGATIVE
Comment: NEGATIVE
Comment: NORMAL
Neisseria Gonorrhea: NEGATIVE
Trichomonas: NEGATIVE

## 2024-02-06 LAB — THYROID PEROXIDASE ANTIBODY: Thyroperoxidase Ab SerPl-aCnc: <1 [IU]/mL (ref <9)

## 2024-02-10 NOTE — Telephone Encounter (Signed)
 no testing on anything then - only wanted the HPV - also ask if there is any way to do HPV without the PAP? they offer swabs for patients to do at home now.  thanks

## 2024-02-11 ENCOUNTER — Ambulatory Visit: Payer: Self-pay | Admitting: Family

## 2024-02-11 DIAGNOSIS — E039 Hypothyroidism, unspecified: Secondary | ICD-10-CM

## 2024-02-11 DIAGNOSIS — Z1151 Encounter for screening for human papillomavirus (HPV): Secondary | ICD-10-CM

## 2024-02-11 MED ORDER — LEVOTHYROXINE SODIUM 137 MCG PO TABS: 150.0000 ug | ORAL_TABLET | Freq: Every day | ORAL | 2 refills | Status: DC

## 2024-03-02 ENCOUNTER — Other Ambulatory Visit: Payer: Self-pay | Admitting: Adult Health

## 2024-03-02 DIAGNOSIS — F330 Major depressive disorder, recurrent, mild: Secondary | ICD-10-CM

## 2024-03-26 ENCOUNTER — Other Ambulatory Visit: Payer: Self-pay | Admitting: Adult Health

## 2024-03-26 DIAGNOSIS — F330 Major depressive disorder, recurrent, mild: Secondary | ICD-10-CM

## 2024-03-26 DIAGNOSIS — F411 Generalized anxiety disorder: Secondary | ICD-10-CM

## 2024-03-29 ENCOUNTER — Other Ambulatory Visit: Payer: Self-pay | Admitting: Adult Health

## 2024-03-29 DIAGNOSIS — F330 Major depressive disorder, recurrent, mild: Secondary | ICD-10-CM

## 2024-04-05 ENCOUNTER — Other Ambulatory Visit: Payer: Self-pay | Admitting: Adult Health

## 2024-04-08 ENCOUNTER — Ambulatory Visit: Admitting: Family

## 2024-04-11 ENCOUNTER — Other Ambulatory Visit: Payer: Self-pay | Admitting: Adult Health

## 2024-04-11 DIAGNOSIS — F330 Major depressive disorder, recurrent, mild: Secondary | ICD-10-CM

## 2024-04-21 ENCOUNTER — Other Ambulatory Visit: Payer: Self-pay | Admitting: Medical Genetics

## 2024-04-24 ENCOUNTER — Other Ambulatory Visit: Payer: Self-pay | Admitting: Family

## 2024-04-24 DIAGNOSIS — E039 Hypothyroidism, unspecified: Secondary | ICD-10-CM

## 2024-04-28 ENCOUNTER — Encounter: Payer: Self-pay | Admitting: Family

## 2024-04-28 ENCOUNTER — Ambulatory Visit: Admitting: Family

## 2024-04-28 VITALS — BP 118/77 | HR 82 | Temp 97.2°F | Ht 65.0 in | Wt 141.2 lb

## 2024-04-28 DIAGNOSIS — Z1159 Encounter for screening for other viral diseases: Secondary | ICD-10-CM

## 2024-04-28 DIAGNOSIS — E782 Mixed hyperlipidemia: Secondary | ICD-10-CM

## 2024-04-28 DIAGNOSIS — E039 Hypothyroidism, unspecified: Secondary | ICD-10-CM

## 2024-04-28 DIAGNOSIS — Z23 Encounter for immunization: Secondary | ICD-10-CM

## 2024-04-28 MED ORDER — LEVOTHYROXINE SODIUM 137 MCG PO TABS: 137.0000 ug | ORAL_TABLET | Freq: Every day | ORAL | 3 refills | Status: DC

## 2024-04-28 NOTE — Progress Notes (Signed)
 Patient ID: Jill Pittman, female    DOB: 02-24-1961, 63 y.o.   MRN: [id number]  Chief Complaint  Patient presents with   Hypothyroidism   Hyperlipidemia  Discussed the use of AI scribe software for clinical note transcription with the patient, who gave verbal consent to proceed.  History of Present Illness   Jill Pittman is a 63 year old female who presents for follow-up on medication refills and vaccinations.  She is addressing an issue with her levothyroxine  prescription, which was filled for thirty days instead of ninety. She is currently taking levothyroxine  for thyroid  management.  She is here to receive her second dose of the Shingrix  vaccine. The first dose caused significant soreness in her arm but no systemic symptoms. She also is needing her pneumonia vaccine.     Assessment and Plan    Hypothyroidism Labs checked at physical in July, wnl.  - Send prescription for 90-day supply of levothyroxine  137mcg daily. - F/U next July for CPE and recheck labs   Immunization due -     Varicella-zoster vaccine subcutaneous -     Pneumococcal conjugate vaccine 20-valent   Subjective:    Outpatient Medications Prior to Visit  Medication Sig Dispense Refill   buPROPion  (WELLBUTRIN  XL) 150 MG 24 hr tablet Take 1 tablet by mouth once daily. 90 tablet 0   buPROPion  (WELLBUTRIN  XL) 300 MG 24 hr tablet Take 1 tablet by mouth daily. 90 tablet 0   Cholecalciferol (VITAMIN D3) 1.25 MG (50000 UT) CAPS Take 1 capsule (50,000 Units total) by mouth once a week. AFTER the last weekly pill, start a daily OTC Vitamin D3 up to 2,000units. 12 capsule 0   hydrOXYzine  (ATARAX ) 25 MG tablet Take 1 tablet (25 mg total) by mouth daily as needed. for anxiety 90 tablet 1   levothyroxine  (SYNTHROID ) 137 MCG tablet Take 1 tablet by mouth once daily before breakfast. 30 tablet 2   metFORMIN  (GLUCOPHAGE ) 500 MG tablet TAKE 1 TABLET BY MOUTH TWICE  DAILY WITH A MEAL 180 tablet 3   REXULTI  1 MG TABS  tablet Take 1 tablet by mouth once daily 30 tablet 0   simvastatin  (ZOCOR ) 10 MG tablet Take 1 tablet (10 mg total) by mouth daily. 90 tablet 3   traZODone  (DESYREL ) 100 MG tablet Take 1 tablet (100 mg total) by mouth at bedtime as needed. 90 tablet 1   venlafaxine  XR (EFFEXOR -XR) 75 MG 24 hr capsule Take 3 capsules by mouth once daily with breakfast. 270 capsule 0   No facility-administered medications prior to visit.   Past Medical History:  Diagnosis Date   History of craniotomy    Hx of resection of meningioma    Hyperlipidemia    Prediabetes    No past surgical history on file. Allergies  Allergen Reactions   Penicillins Rash      Objective:    Physical Exam Vitals and nursing note reviewed.  Constitutional:      Appearance: Normal appearance.  Cardiovascular:     Rate and Rhythm: Normal rate and regular rhythm.  Pulmonary:     Effort: Pulmonary effort is normal.     Breath sounds: Normal breath sounds.  Musculoskeletal:        General: Normal range of motion.  Skin:    General: Skin is warm and dry.  Neurological:     Mental Status: She is alert.  Psychiatric:        Mood and Affect: Mood normal.  Behavior: Behavior normal.    BP 118/77 (BP Location: Left Arm, Patient Position: Sitting)   Pulse 82   Temp (!) 97.2 F (36.2 C) (Temporal)   Ht 5' 5 (1.651 m)   Wt 141 lb 4 oz (64.1 kg)   SpO2 94%   BMI 23.51 kg/m  Wt Readings from Last 3 Encounters:  04/28/24 141 lb 4 oz (64.1 kg)  02/04/24 139 lb 6.4 oz (63.2 kg)  11/05/22 143 lb 6.4 oz (65 kg)      Lucius Krabbe, NP

## 2024-04-30 ENCOUNTER — Telehealth: Admitting: Adult Health

## 2024-04-30 ENCOUNTER — Encounter: Payer: Self-pay | Admitting: Adult Health

## 2024-04-30 DIAGNOSIS — F330 Major depressive disorder, recurrent, mild: Secondary | ICD-10-CM | POA: Diagnosis not present

## 2024-04-30 DIAGNOSIS — F410 Panic disorder [episodic paroxysmal anxiety] without agoraphobia: Secondary | ICD-10-CM

## 2024-04-30 DIAGNOSIS — F411 Generalized anxiety disorder: Secondary | ICD-10-CM | POA: Diagnosis not present

## 2024-04-30 DIAGNOSIS — F430 Acute stress reaction: Secondary | ICD-10-CM

## 2024-04-30 DIAGNOSIS — G4700 Insomnia, unspecified: Secondary | ICD-10-CM | POA: Diagnosis not present

## 2024-04-30 MED ORDER — VENLAFAXINE HCL ER 75 MG PO CP24: ORAL_CAPSULE | ORAL | 1 refills | Status: AC

## 2024-04-30 MED ORDER — BUPROPION HCL ER (XL) 150 MG PO TB24: 150.0000 mg | ORAL_TABLET | Freq: Every day | ORAL | 1 refills | Status: AC

## 2024-04-30 MED ORDER — TRAZODONE HCL 100 MG PO TABS: 100.0000 mg | ORAL_TABLET | Freq: Every evening | ORAL | 1 refills | Status: DC | PRN

## 2024-04-30 MED ORDER — BREXPIPRAZOLE 1 MG PO TABS: 1.0000 mg | ORAL_TABLET | Freq: Every day | ORAL | 1 refills | Status: AC

## 2024-04-30 MED ORDER — BUPROPION HCL ER (XL) 300 MG PO TB24
300.0000 mg | ORAL_TABLET | Freq: Every day | ORAL | 1 refills | Status: AC
Start: 2024-04-30 — End: ?

## 2024-04-30 MED ORDER — HYDROXYZINE HCL 25 MG PO TABS: 25.0000 mg | ORAL_TABLET | Freq: Every day | ORAL | 1 refills | Status: AC | PRN

## 2024-04-30 NOTE — Progress Notes (Signed)
 Jill Pittman [id number] Dec 07, 1960 63 y.o.  Virtual Visit via Video Note  I connected with pt @ on 04/30/24 at  2:00 PM EDT by a video enabled telemedicine application and verified that I am speaking with the correct person using two identifiers.   I discussed the limitations of evaluation and management by telemedicine and the availability of in person appointments. The patient expressed understanding and agreed to proceed.  I discussed the assessment and treatment plan with the patient. The patient was provided an opportunity to ask questions and all were answered. The patient agreed with the plan and demonstrated an understanding of the instructions.   The patient was advised to call back or seek an in-person evaluation if the symptoms worsen or if the condition fails to improve as anticipated.  I provided 25 minutes of non-face-to-face time during this encounter.  The patient was located at home.  The provider was located at [facility].   Angeline LOISE Sayers, NP   Subjective:   Patient ID:  Jill Pittman is a 63 y.o. (DOB 1961/03/19) female.  Chief Complaint: No chief complaint on file.   HPI Jill Pittman presents for follow-up of MDD, GAD, insomnia and panic attacks.  Previously seen by Dr. Hisada for medication management.   Describes mood today as ok. Pleasant. Denies tearfulness. Mood symptoms - reports situational depression and anxiety Reports stable interest and motivation. Reports decreased irritability. Denies recent panic attacks. Reports some worry, rumination and over thinking. Reports mood as more stable - even. Stating I feel like I'm doing ok. Feels like medications are helpful. Taking medications as prescribed.  Energy levels stable. Active, does not have a regular exercise routine.   Enjoys some usual interests and activities. Married. Lives with husband. Has one step son. Friends and family in the area. Appetite adequate. Weight stable - 140  pounds - 65. Sleeps well most nights. Averages 6 hours. Focus and concentration has improved - it's been better. Completing tasks. Managing aspects of household. Works full time. Denies SI or HI.  Denies AH or VH. Denies self harm. Denies substance use.  Previous medication trials: Lexapro, fluoxetine, bupropion , quetiapine  (fatigue)   Review of Systems:  Review of Systems  Musculoskeletal:  Negative for gait problem.  Neurological:  Negative for tremors.  Psychiatric/Behavioral:         Please refer to HPI    Medications: I have reviewed the patient's current medications.  Current Outpatient Medications  Medication Sig Dispense Refill   buPROPion  (WELLBUTRIN  XL) 150 MG 24 hr tablet Take 1 tablet by mouth once daily. 90 tablet 0   buPROPion  (WELLBUTRIN  XL) 300 MG 24 hr tablet Take 1 tablet by mouth daily. 90 tablet 0   Cholecalciferol (VITAMIN D3) 1.25 MG (50000 UT) CAPS Take 1 capsule (50,000 Units total) by mouth once a week. AFTER the last weekly pill, start a daily OTC Vitamin D3 up to 2,000units. 12 capsule 0   hydrOXYzine  (ATARAX ) 25 MG tablet Take 1 tablet (25 mg total) by mouth daily as needed. for anxiety 90 tablet 1   levothyroxine  (SYNTHROID ) 137 MCG tablet Take 1 tablet (137 mcg total) by mouth daily before breakfast. 90 tablet 3   metFORMIN  (GLUCOPHAGE ) 500 MG tablet TAKE 1 TABLET BY MOUTH TWICE  DAILY WITH A MEAL 180 tablet 3   REXULTI  1 MG TABS tablet Take 1 tablet by mouth once daily 30 tablet 0   simvastatin  (ZOCOR ) 10 MG tablet Take 1 tablet (10 mg total) by mouth daily.  90 tablet 3   traZODone  (DESYREL ) 100 MG tablet Take 1 tablet (100 mg total) by mouth at bedtime as needed. 90 tablet 1   venlafaxine  XR (EFFEXOR -XR) 75 MG 24 hr capsule Take 3 capsules by mouth once daily with breakfast. 270 capsule 0   No current facility-administered medications for this visit.    Medication Side Effects: None  Allergies:  Allergies  Allergen Reactions   Penicillins  Rash    Past Medical History:  Diagnosis Date   History of craniotomy    Hx of resection of meningioma    Hyperlipidemia    Prediabetes     Family History  Problem Relation Age of Onset   Alcohol abuse Mother    Breast cancer Maternal Grandmother    Alcohol abuse Maternal Grandfather     Social History   Socioeconomic History   Marital status: Married    Spouse name: Not on file   Number of children: Not on file   Years of education: Not on file   Highest education level: Not on file  Occupational History   Not on file  Tobacco Use   Smoking status: Never   Smokeless tobacco: Never  Substance and Sexual Activity   Alcohol use: Not on file   Drug use: Not on file   Sexual activity: Not on file  Other Topics Concern   Not on file  Social History Narrative   pt married, moved from [place] during pandemic, rented RV and travelled all over [place]  for 1 year   Social Drivers of Corporate investment banker Strain: Not on file  Food Insecurity: Not on file  Transportation Needs: Not on file  Physical Activity: Not on file  Stress: Not on file  Social Connections: Not on file  Intimate Partner Violence: Not on file    Past Medical History, Surgical history, Social history, and Family history were reviewed and updated as appropriate.   Please see review of systems for further details on the patient's review from today.   Objective:   Physical Exam:  There were no vitals taken for this visit.  Physical Exam Constitutional:      General: She is not in acute distress. Musculoskeletal:        General: No deformity.  Neurological:     Mental Status: She is alert and oriented to person, place, and time.     Coordination: Coordination normal.  Psychiatric:        Attention and Perception: Attention and perception normal. She does not perceive auditory or visual hallucinations.        Mood and Affect: Mood normal. Mood is not anxious or depressed. Affect is not labile,  blunt, angry or inappropriate.        Speech: Speech normal.        Behavior: Behavior normal.        Thought Content: Thought content normal. Thought content is not paranoid or delusional. Thought content does not include homicidal or suicidal ideation. Thought content does not include homicidal or suicidal plan.        Cognition and Memory: Cognition and memory normal.        Judgment: Judgment normal.     Comments: Insight intact     Lab Review:     Component Value Date/Time   NA 139 02/04/2024 0956   K 4.4 02/04/2024 0956   CL 103 02/04/2024 0956   CO2 29 02/04/2024 0956   GLUCOSE 138 (H) 02/04/2024 9043  BUN 10 02/04/2024 0956   CREATININE 0.65 02/04/2024 0956   CALCIUM 9.5 02/04/2024 0956   PROT 7.6 02/04/2024 0956   ALBUMIN 4.4 02/04/2024 0956   AST 18 02/04/2024 0956   ALT 29 02/04/2024 0956   ALKPHOS 89 02/04/2024 0956   BILITOT 0.4 02/04/2024 0956       Component Value Date/Time   WBC 6.8 02/04/2024 0956   RBC 4.93 02/04/2024 0956   HGB 14.1 02/04/2024 0956   HCT 43.1 02/04/2024 0956   PLT 297.0 02/04/2024 0956   MCV 87.4 02/04/2024 0956   MCHC 32.7 02/04/2024 0956   RDW 13.2 02/04/2024 0956   LYMPHSABS 2.6 02/04/2024 0956   MONOABS 0.3 02/04/2024 0956   EOSABS 0.3 02/04/2024 0956   BASOSABS 0.0 02/04/2024 0956    No results found for: POCLITH, LITHIUM   No results found for: PHENYTOIN, PHENOBARB, VALPROATE, CBMZ   .res Assessment: Plan:    Plan:  PDMP reviewed  Rexulti  1mg  tablet daily   Buproprion 450mg  daily 300/150mg  Venlafaxine  225mg  daily   Trazodone  100mg  at night as needed for sleep Hydroxyzine  25mg  daily as needed for anxiety   RTC 6 months  25 minutes spent dedicated to the care of this patient on the date of this encounter to include pre-visit review of records, ordering of medication, post visit documentation, and face-to-face time with the patient discussing MDD, GAD, insomnia and panic attacks.  Patient advised  to contact office with any questions, adverse effects, or acute worsening in signs and symptoms.  Discussed potential metabolic side effects associated with atypical antipsychotics, as well as potential risk for movement side effects. Advised pt to contact office if movement side effects occur.   Discussed continuing current medication regimen.  There are no diagnoses linked to this encounter.   Please see After Visit Summary for patient specific instructions.  Future Appointments  Date Time Provider Department Center  04/30/2024  2:00 PM Cyera Balboni Nattalie, NP [facility] None    No orders of the defined types were placed in this encounter.     -------------------------------

## 2024-05-08 DIAGNOSIS — Z09 Encounter for follow-up examination after completed treatment for conditions other than malignant neoplasm: Secondary | ICD-10-CM | POA: Diagnosis not present

## 2024-05-08 DIAGNOSIS — Z86018 Personal history of other benign neoplasm: Secondary | ICD-10-CM | POA: Diagnosis not present

## 2024-05-08 NOTE — Patient Instructions (Addendum)
 COLONOSCOPY TRILYTE SPLIT   1.  Your procedure is scheduled for:   THURSDAY  OCTOBER 83,7974    2.  Your procedure is scheduled with   [x]   Dr. Frederic Schick   3.  Your procedure will be performed at:  [x]   [facility] (Do not call their office to cancel or reschedule)       [address]       [place], [place] [address]       [phone]  4.  To get your scheduled arrival time:  [x]   [facility] OCTOBER 84,7974       They will contact you the day prior with your arrival time.        If you have not heard from their office by 1:00, please contact [facility] directly at [phone].    [x]  5 days before procedure: NO NUTS, SEEDS, CORN, OR POPCORN         [x]  5 days prior to STARTING your BOWEL PREP . Please follow a Low Fiber Diet.        These are examples of low fiber foods.  Cheese Chicken Fruit Juice (no pulp)  Yogurt [place] Soda  Milk or Milk Products Lamb Coffee  White Bread Pork Tea  Pasta Eggs    Fish/Seafood    Tofu    Peanut butter (smooth)   Applesauce Custard Sugar  Canned or cooked Vegetables no skin or seeds Pudding Sugar Subsitute   Gelatin Honey   Ice Cream Jelly   Cookies             YOU WILL NEED TO PURCHASE AT THE PHARMACY:  1.  TRILYTE PREP KIT (Rx electronically sent to pharmacy)       Simethicone (Gas -X, 125mg ): 4 tablets      The DAY BEFORE your procedure:      WEDNESDAY OCTOBER 84,7974                                                                              In the morning, prepare by filling the jug with lukewarm water to the "fill line"  Shake well and place jug in the refrigerator to chill. *You may add Crystal Light Flavor Packet (no red or purple) to the container to give it more flavoring.  Drink clear liquids ALL DAY long.  NO FOOD.  Clear liquids consist of anything you can hold up to light and see through.   EXAMPLES: SOFT DRINKS- Orange, ginger ale, cola, pepsi, sprite, 7-up,  Gatorade, Kool-Aid(not red or purple) STRAINED FRUIT JUICES WITHOUT PULP- Apple, white grape, lemonade. WATER, TEA, COFFEE (no milk or non-dairy creamer) CHICKEN OR BEEF BROTH OR BOUILLON HARD CANDIES, POPSCICLES JELLO (lemon, lime, orange: NO FRUITS OR TOPPINGS)  NOTHING RED OR PURPLE.   THERE IS NO LIMIT ON THE AMOUNT OF CLEAR LIQUIDS YOU CAN HAVE AND THE MORE YOU DRINK THE BETTER YOU WILL FEEL AND THE BETTER YOUR CLEAN OUT WILL BE.   Do not only drink water or gatorade only, you must incorporate a mixture of clear liquids listed above. Drinking water alone will result in a poor prep.  Try to drink at least 1 glass  every hour of clear fluids over the course of the day.    4.   Start drinking your prep between 3:00-6:00pm.  You may use a straw to drink your               prep.  Drink an 8 oz glass every 20-30 minutes until HALF of the solution is gone.  5. 6:00pm Take two Simethicone tablets with a clear beverage of your choice.   *It is normal to experience cramping and/or bloating with the prep, walking around can help. *If you should become nauseated, stop for 45-60 minutes to let your stomach settle and then resume an 8 oz glass every 45 minutes.*  Please continue with clear liquids up to 3 hours prior to your arrival time.   The DAY OF your procedure:   THURSDAY  OCTOBER 83,7974                                                                                                              1.  5 hours before your colonoscopy:             Drink (1) 8-ounce glass of the prep solution every 15 minutes until the solution is finished-about 4 glasses.             Take (2) Simethicone Tablets             Keep drinking clear liquids (water, tea, apple juice) until 3 hours before your arrival time.              . [x]    Two (2) hours before your scheduled procedure:                    You may take the following medications with just enough water to swallow your pills, no later than 6am.                                         Heart (cholesterol)                     Seizure                     Blood pressure                      Breathing                      Parkinson's                     Thyroid               4.  Do not take Aspirin, Multivitamins, Fish Oil, Ibuprofen or NSAIDS the day of your procedure.     PROCEDURE LOCATION INSTRUCTIONS   [x]   [facility] at [facility] at your designated time.  Notify the nurse/doctor if you have had any changes in your medical condition since your last office visit.  You will receive medication to make you sleep for the test.  You MUST have someone drive you Home and be responsible for your care*.  You can NOT drive, work, or operate heavy machinery for 24 hours after the test.  After your procedure, the nurse will go over discharge instructions. If you have any questions, a need to cancel or reschedule, call our office at [phone].  If you have billing questions, please call [facility] directly at [phone].    Additional Information:  We recommend that you verify your insurance benefits and expected out of pocket costs prior to your procedure. We also recommend that you verify providers including anesthesiologist and the chosen facility are in net-work. While our office will perform a prior authorization we will not know the cost of your procedure.  You MUST have someone drive you Home from your procedure.  You must have a responsible adult with a valid Driver's License who is on site throughout your entire procedure and who can stay with you for several hours after your procedure. This applies for [facility]. Without verification that you have someone to do this, they will not proceed with your procedure. You may not go home alone in a taxi, shuttle [place], [place] or bus, as the drivers will not be responsible for you.   Please notify our office if  your medical status changes BEFORE your procedure date. This includes new diagnoses, surgeries, medications, and hospital visits/admissions. Failure to do this can result in your procedure being cancelled.   To ensure a successful exam, please follow all instructions carefully. Failure to accurately and completely prepare for your exam may result in the need for an additional procedures will be billed to your insurance.    GI Financial Statement Policy:  Screening colonoscopy verses diagnostic colonoscopy: Insurance plans make a distinction between "screening" and "diagnostic" when determining benefit coverage for a colonoscopy. Many insurance plans cover "screening" colonoscopy procedures and pay up to 100% of the cost with minimal out-of-pocket cost to the patient. However, many insurance plans do not pay 100% of the cost of "diagnostic" colonscopies, but pass on a portion of the cost to the patient through deductibles and coinsurance. The colonoscopy procedure is defined as "screening" or "diagnostic" based on specific rules and conditions. A colonoscopy is considered "screening" if the patient does not have any GI symptoms and no polyps or masses are found during the colonoscopy. Screening Colonoscopy:  No lower gastrointestinal signs or symptoms before the colonoscopy No polyps or masses are found during the colonoscopy  You have no personal history of polyps or colon cancer  Diagnostic Colonoscopy: If you have any of the following symptoms in the lower gastrointestinal tract noted in your medical record before the procedure, your colonoscopy will be diagnostic: Abdominal pain that does not improve Anemia  Change in bowel habits Constipation Diarrhea Rectal bleeding Blood in stool Polyps within the past 10 years A positive stool-based test or CT colonography- requiring a follow-up colonoscopy What you need to do: It is important for you to call your insurance provider before your  colonoscopy appointment to  understand what your out-of-pocket cost will be.  Questions to ask your insurance company representative: If I am scheduled for a screening colonoscopy and the physician takes a biopsy or removes a polyp , will the claim still be covered under  my preventative screening benefits with no out-of-pocket costs (e.g., deductible, coinsurance) for me? How is this procedure covered in relation to co-payment, deductible and co-insurance? How much am I responsible for?  Is my benefit period based on a calendar year (January-December); or a contract year (e.g., July 1-June 30)?   ANESTHESIA ANESTHESIA: PROPOFOL (anesthesia code for a preventative colonoscopy is 00812/diagnostic colonoscopy is 00811/EGD code is 00731)       IS THIS COVERED FOR MY PROCEDURE? AM I RESPONSIBLE FOR ANY OF THE COST? If you are not covered for Propofol call our office at [phone] to let us  know.  Propofol is ONLY used at [facility] and [facility]   *IF YOU HAVE A PERSONAL HISTORY OF COLON POLYPS, BARRETT'S ESOPHAGUS, ULCERATIVE COLITIS OR CROHN'S, THIS IS CONSIDERED DIAGNOSTIC PER INSURANCE COMPANY POLICIES AND WILL BE COVERED UNDER YOUR DIAGNOSTIC BENEFITS, NOT SCREENING BENEFITS.    Insurance benefits vary depending on your policy. Some procedures may be covered at 100% or may be subject to the deductible. It is important for you to know your insurance benefits BEFORE your procedure. Please contact the Gastro Department at [phone] if you have any questions.

## 2024-05-11 ENCOUNTER — Encounter: Payer: Self-pay | Admitting: Family

## 2024-05-12 ENCOUNTER — Other Ambulatory Visit: Payer: Self-pay | Admitting: Family

## 2024-05-12 DIAGNOSIS — R7303 Prediabetes: Secondary | ICD-10-CM

## 2024-05-12 MED ORDER — METFORMIN HCL 500 MG PO TABS
500.0000 mg | ORAL_TABLET | Freq: Two times a day (BID) | ORAL | 3 refills | Status: AC
Start: 2024-05-12 — End: ?

## 2024-05-12 NOTE — Telephone Encounter (Signed)
 The last RX sent in May was to [facility] - I have sent a new RX to [facility], thx

## 2024-05-21 ENCOUNTER — Ambulatory Visit (INDEPENDENT_AMBULATORY_CARE_PROVIDER_SITE_OTHER): Payer: Self-pay

## 2024-05-21 DIAGNOSIS — Z09 Encounter for follow-up examination after completed treatment for conditions other than malignant neoplasm: Secondary | ICD-10-CM | POA: Diagnosis not present

## 2024-05-21 DIAGNOSIS — Z538 Procedure and treatment not carried out for other reasons: Secondary | ICD-10-CM | POA: Diagnosis not present

## 2024-05-21 DIAGNOSIS — Z86018 Personal history of other benign neoplasm: Secondary | ICD-10-CM | POA: Diagnosis not present

## 2024-05-21 DIAGNOSIS — Z860100 Personal history of colon polyps, unspecified: Secondary | ICD-10-CM | POA: Diagnosis not present

## 2024-05-25 ENCOUNTER — Other Ambulatory Visit

## 2024-05-27 ENCOUNTER — Encounter: Payer: Self-pay | Admitting: Sports Medicine

## 2024-06-08 ENCOUNTER — Encounter: Payer: Self-pay | Admitting: Radiology

## 2024-06-18 ENCOUNTER — Ambulatory Visit: Admitting: Sports Medicine

## 2024-06-18 ENCOUNTER — Encounter: Payer: Self-pay | Admitting: Sports Medicine

## 2024-06-18 ENCOUNTER — Other Ambulatory Visit: Payer: Self-pay

## 2024-06-18 DIAGNOSIS — M1611 Unilateral primary osteoarthritis, right hip: Secondary | ICD-10-CM

## 2024-06-18 DIAGNOSIS — G8929 Other chronic pain: Secondary | ICD-10-CM | POA: Diagnosis not present

## 2024-06-18 DIAGNOSIS — M25551 Pain in right hip: Secondary | ICD-10-CM | POA: Diagnosis not present

## 2024-06-18 MED ORDER — LIDOCAINE HCL 1 % IJ SOLN
4.0000 mL | INTRAMUSCULAR | Status: AC | PRN
  Administered 2024-06-18: 4 mL

## 2024-06-18 MED ORDER — METHYLPREDNISOLONE ACETATE 40 MG/ML IJ SUSP
80.0000 mg | INTRAMUSCULAR | Status: AC | PRN
  Administered 2024-06-18: 80 mg via INTRA_ARTICULAR

## 2024-06-18 NOTE — Progress Notes (Signed)
 Jill Pittman - 63 y.o. female MRN [id number]  Date of birth: 1961-05-04  Office Visit Note: Visit Date: 06/18/2024 PCP: Lucius Krabbe, NP Referred by: Lucius Krabbe, NP  Subjective: Chief Complaint  Patient presents with   Right Hip - Pain   HPI: Jill Pittman is a pleasant 63 y.o. female who presents today for acute on chronic right hip pain.  Back on 03/09/24 we did perform ultrasound-guided right hip injection, this gave her excellent relief for at least 6 months.  She reports end of October her pain started to return.  She is having pain with flexing and activating the hip joint.  She has been using naproxen the last few weeks as her pain has increased.  She is inquiring about other treatments short of surgical replacement.  We did review her hip x-rays today.  Pertinent ROS were reviewed with the patient and found to be negative unless otherwise specified above in HPI.   Assessment & Plan: Visit Diagnoses:  1. Unilateral primary osteoarthritis, right hip   2. Chronic right hip pain    Plan: Impression is chronic right hip osteoarthritis which is moderate in nature with current exacerbation.  She received at least 6 months of relief from previous ultrasound-guided injection.  Through shared decision making, we did repeat this for the right hip.  Patient tolerated well, advised on postinjection protocol.  May use ice/heat and/or naproxen as needed for pain control.  We discussed physical activity, remaining active and flexibility about the hip joint.  Given that her hip arthritis is moderate and she has received such long-lasting relief from the injection, we will hold on further discussion on THA, but this could be an option in the future.  She will follow-up with me as needed.  Follow-up: Return if symptoms worsen or fail to improve.   Meds & Orders: No orders of the defined types were placed in this encounter.   Orders Placed This Encounter  Procedures   Large Joint  Inj   US  Guided Needle Placement - No Linked Charges     Procedures: Large Joint Inj: R hip joint on 06/18/2024 8:19 AM Indications: pain Details: 22 G 3.5 in needle, ultrasound-guided anterior approach Medications: 4 mL lidocaine  1 %; 80 mg methylPREDNISolone  acetate 40 MG/ML Outcome: tolerated well, no immediate complications  Procedure: US -guided intra-articular hip injection, Right After discussion on risks/benefits/indications and informed verbal consent was obtained, a timeout was performed. Patient was lying supine on exam table. The hip was cleaned with betadine and alcohol swabs. Then utilizing ultrasound guidance, the patient's femoral head and neck junction was identified and subsequently injected with 4:2 lidocaine :depomedrol via an in-plane approach with ultrasound visualization of the injectate administered into the hip joint. Patient tolerated procedure well without immediate complications.  Procedure, treatment alternatives, risks and benefits explained, specific risks discussed. Consent was given by the patient. Immediately prior to procedure a time out was called to verify the correct patient, procedure, equipment, support staff and site/side marked as required. Patient was prepped and draped in the usual sterile fashion.          Clinical History: No specialty comments available.  She reports that she has never smoked. She has never used smokeless tobacco. No results for input(s): HGBA1C, LABURIC in the last 8760 hours.  Objective:    Physical Exam  Gen: Well-appearing, in no acute distress; non-toxic CV: Well-perfused. Warm.  Resp: Breathing unlabored on room air; no wheezing. Psych: Fluid speech in conversation; appropriate affect; normal thought  process  Ortho Exam - Right hip: There is no redness swelling or effusion.  There is pain with hip flexion, positive Stinchfield testing.  Imaging:  *2 view x-ray of the right hip  from 11/12/2023 including AP and  lateral film were independently reviewed and interpreted by myself today.  X-rays demonstrate at least moderate osteoarthritic change with overgrowth of the superolateral neck. No acute fracture noted.  Past Medical/Family/Surgical/Social History: Medications & Allergies reviewed per EMR, new medications updated. 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 No pertinent surgical history. Social History   Occupational History   Not on file  Tobacco Use   Smoking status: Never   Smokeless tobacco: Never  Substance and Sexual Activity   Alcohol use: Not on file   Drug use: Not on file   Sexual activity: Not on file

## 2024-06-18 NOTE — Progress Notes (Signed)
 Patient says that she got about 6 months of relief from her injection in April. Her pain feels similar now to the way that it felt at that time, and seems to wrap around the entire hip; her pain does not go into the groin, although she feels it will eventually. She is taking Naproxen for her pain, which gives her some relief. She is here today for repeat injection.

## 2024-07-09 ENCOUNTER — Other Ambulatory Visit: Payer: Self-pay | Admitting: Family

## 2024-07-09 DIAGNOSIS — E039 Hypothyroidism, unspecified: Secondary | ICD-10-CM

## 2024-07-11 DIAGNOSIS — Z1231 Encounter for screening mammogram for malignant neoplasm of breast: Secondary | ICD-10-CM | POA: Diagnosis not present

## 2024-07-13 ENCOUNTER — Other Ambulatory Visit: Payer: Self-pay | Admitting: Adult Health

## 2024-07-13 DIAGNOSIS — G4700 Insomnia, unspecified: Secondary | ICD-10-CM

## 2024-08-03 ENCOUNTER — Encounter: Payer: Self-pay | Admitting: Adult Health

## 2024-08-03 ENCOUNTER — Telehealth: Admitting: Adult Health

## 2024-08-03 DIAGNOSIS — F411 Generalized anxiety disorder: Secondary | ICD-10-CM

## 2024-08-03 DIAGNOSIS — G4700 Insomnia, unspecified: Secondary | ICD-10-CM | POA: Diagnosis not present

## 2024-08-03 DIAGNOSIS — F330 Major depressive disorder, recurrent, mild: Secondary | ICD-10-CM | POA: Diagnosis not present

## 2024-08-03 DIAGNOSIS — F430 Acute stress reaction: Secondary | ICD-10-CM

## 2024-08-03 DIAGNOSIS — F410 Panic disorder [episodic paroxysmal anxiety] without agoraphobia: Secondary | ICD-10-CM | POA: Diagnosis not present

## 2024-08-03 NOTE — Progress Notes (Signed)
 Jill Pittman [id number] 02-Sep-1960 63 y.o.  Virtual Visit via Video Note  I connected with pt @ on 08/03/2024 at  2:00 PM EST by a video enabled telemedicine application and verified that I am speaking with the correct person using two identifiers.   I discussed the limitations of evaluation and management by telemedicine and the availability of in person appointments. The patient expressed understanding and agreed to proceed.  I discussed the assessment and treatment plan with the patient. The patient was provided an opportunity to ask questions and all were answered. The patient agreed with the plan and demonstrated an understanding of the instructions.   The patient was advised to call back or seek an in-person evaluation if the symptoms worsen or if the condition fails to improve as anticipated.  I provided 25 minutes of non-face-to-face time during this encounter.  The patient was located at home.  The provider was located at [facility].   Angeline LOISE Sayers, NP   Subjective:   Patient ID:  Jill Pittman is a 63 y.o. (DOB July 15, 1961) female.  Chief Complaint: No chief complaint on file.   HPI Jill Pittman presents for follow-up of MDD, GAD, insomnia and panic attacks.  Previously seen by Dr. Hisada for medication management.   Describes mood today as ok. Pleasant. Denies tearfulness. Mood symptoms - reports situational depression - last few months troubling. Reports lower interest and motivation. Reports loss of a friend. Reports decreased anxiety and irritability. Denies recent panic attacks. Reports some over thinking. Denies worry and rumination. Reports mood as stable - for the most part. Stating I feel like I'm doing alright. Feels like medications are helpful. Taking medications as prescribed.  Energy levels lower. Active, does not have a regular exercise routine.   Enjoys some usual interests and activities. Married. Lives with husband. Has one step  son. Friends and family in the area. Appetite adequate. Weight stable - 140 pounds - 65. Sleeps well most nights. Averages 6 hours. Focus and concentration has improved, but still not where it needs to be. Completing tasks. Managing aspects of household. Works full time. Denies SI or HI.  Denies AH or VH. Denies self harm. Denies substance use.  Previous medication trials: Lexapro, fluoxetine, bupropion , quetiapine  (fatigue)  Review of Systems:  Review of Systems  Musculoskeletal:  Negative for gait problem.  Neurological:  Negative for tremors.  Psychiatric/Behavioral:         Please refer to HPI    Medications: I have reviewed the patient's current medications.  Current Outpatient Medications  Medication Sig Dispense Refill   brexpiprazole  (REXULTI ) 1 MG TABS tablet Take 1 tablet (1 mg total) by mouth daily. 90 tablet 1   buPROPion  (WELLBUTRIN  XL) 150 MG 24 hr tablet Take 1 tablet (150 mg total) by mouth daily. 90 tablet 1   buPROPion  (WELLBUTRIN  XL) 300 MG 24 hr tablet Take 1 tablet (300 mg total) by mouth daily. 90 tablet 1   Cholecalciferol (VITAMIN D3) 1.25 MG (50000 UT) CAPS Take 1 capsule (50,000 Units total) by mouth once a week. AFTER the last weekly pill, start a daily OTC Vitamin D3 up to 2,000units. 12 capsule 0   hydrOXYzine  (ATARAX ) 25 MG tablet Take 1 tablet (25 mg total) by mouth daily as needed. for anxiety 90 tablet 1   levothyroxine  (SYNTHROID ) 137 MCG tablet Take 1 tablet by mouth once daily before breakfast. 30 tablet 1   metFORMIN  (GLUCOPHAGE ) 500 MG tablet Take 1 tablet (500 mg total) by mouth  2 (two) times daily with a meal. 180 tablet 3   simvastatin  (ZOCOR ) 10 MG tablet Take 1 tablet (10 mg total) by mouth daily. 90 tablet 3   traZODone  (DESYREL ) 100 MG tablet TAKE 1 TABLET BY MOUTH AT BEDTIME AS NEEDED 90 tablet 0   venlafaxine  XR (EFFEXOR -XR) 75 MG 24 hr capsule Take 3 capsules by mouth once daily with breakfast. 270 capsule 1   No current  facility-administered medications for this visit.    Medication Side Effects: None  Allergies: Allergies[1]  Past Medical History:  Diagnosis Date   History of craniotomy    Hx of resection of meningioma    Hyperlipidemia    Prediabetes     Family History  Problem Relation Age of Onset   Alcohol abuse Mother    Breast cancer Maternal Grandmother    Alcohol abuse Maternal Grandfather     Social History   Socioeconomic History   Marital status: Married    Spouse name: Not on file   Number of children: Not on file   Years of education: Not on file   Highest education level: Not on file  Occupational History   Not on file  Tobacco Use   Smoking status: Never   Smokeless tobacco: Never  Substance and Sexual Activity   Alcohol use: Not on file   Drug use: Not on file   Sexual activity: Not on file  Other Topics Concern   Not on file  Social History Narrative   pt married, moved from [place] during pandemic, rented RV and travelled all over [place]  for 1 year   Social Drivers of Health   Tobacco Use: Low Risk (08/03/2024)   Patient History    Smoking Tobacco Use: Never    Smokeless Tobacco Use: Never    Passive Exposure: Not on file  Financial Resource Strain: Low Risk  (05/08/2024)   Received from [facility]   Overall Financial Resource Strain (CARDIA)    Difficulty of Paying Living Expenses: Not very hard  Food Insecurity: No Food Insecurity (05/08/2024)   Received from [facility]   Epic    Within the past 12 months, you worried that your food would run out before you got the money to buy more.: Never true    Within the past 12 months, the food you bought just didn't last and you didn't have money to get more.: Never true  Transportation Needs: No Transportation Needs (05/08/2024)   Received from [facility] - Transportation    In the past 12 months, has lack of transportation kept you from medical  appointments or from getting medications?: No    Lack of Transportation (Non-Medical): No  Physical Activity: Not on file  Stress: Not on file  Social Connections: Not on file  Intimate Partner Violence: Not on file  Depression (PHQ2-9): Low Risk (04/28/2024)   Depression (PHQ2-9)    PHQ-2 Score: 3  Alcohol Screen: Not on file  Housing: Low Risk  (05/08/2024)   Received from [facility]   Epic    In the last 12 months, was there a time when you were not able to pay the mortgage or rent on time?: No    In the past 12 months, how many times have you moved where you were living?: 0    At any time in the past 12 months, were you homeless or living in a shelter (including now)?: No  Utilities:  Not At Risk (05/08/2024)   Received from [facility]   Epic    In the past 12 months has the electric, gas, oil, or water company threatened to shut off services in your home?: No  Health Literacy: Not on file    Past Medical History, Surgical history, Social history, and Family history were reviewed and updated as appropriate.   Please see review of systems for further details on the patient's review from today.   Objective:   Physical Exam:  There were no vitals taken for this visit.  Physical Exam Constitutional:      General: She is not in acute distress. Musculoskeletal:        General: No deformity.  Neurological:     Mental Status: She is alert and oriented to person, place, and time.     Coordination: Coordination normal.  Psychiatric:        Attention and Perception: Attention and perception normal. She does not perceive auditory or visual hallucinations.        Mood and Affect: Mood normal. Mood is not anxious or depressed. Affect is not labile, blunt, angry or inappropriate.        Speech: Speech normal.        Behavior: Behavior normal.        Thought Content: Thought content normal. Thought content is not paranoid or delusional. Thought  content does not include homicidal or suicidal ideation. Thought content does not include homicidal or suicidal plan.        Cognition and Memory: Cognition and memory normal.        Judgment: Judgment normal.     Comments: Insight intact     Lab Review:     Component Value Date/Time   NA 139 02/04/2024 0956   K 4.4 02/04/2024 0956   CL 103 02/04/2024 0956   CO2 29 02/04/2024 0956   GLUCOSE 138 (H) 02/04/2024 0956   BUN 10 02/04/2024 0956   CREATININE 0.65 02/04/2024 0956   CALCIUM 9.5 02/04/2024 0956   PROT 7.6 02/04/2024 0956   ALBUMIN 4.4 02/04/2024 0956   AST 18 02/04/2024 0956   ALT 29 02/04/2024 0956   ALKPHOS 89 02/04/2024 0956   BILITOT 0.4 02/04/2024 0956       Component Value Date/Time   WBC 6.8 02/04/2024 0956   RBC 4.93 02/04/2024 0956   HGB 14.1 02/04/2024 0956   HCT 43.1 02/04/2024 0956   PLT 297.0 02/04/2024 0956   MCV 87.4 02/04/2024 0956   MCHC 32.7 02/04/2024 0956   RDW 13.2 02/04/2024 0956   LYMPHSABS 2.6 02/04/2024 0956   MONOABS 0.3 02/04/2024 0956   EOSABS 0.3 02/04/2024 0956   BASOSABS 0.0 02/04/2024 0956    No results found for: POCLITH, LITHIUM   No results found for: PHENYTOIN, PHENOBARB, VALPROATE, CBMZ   .res Assessment: Plan:    Plan:  PDMP reviewed  Discussed possible decrease in Rexulti  or Wellbutrin  to see if mood remains stable.   Rexulti  1mg  tablet daily   Buproprion 450mg  daily 300/150mg  Venlafaxine  225mg  daily   Trazodone  100mg  at night as needed for sleep Hydroxyzine  25mg  daily as needed for anxiety   RTC 3 months  25 minutes spent dedicated to the care of this patient on the date of this encounter to include pre-visit review of records, ordering of medication, post visit documentation, and face-to-face time with the patient discussing MDD, GAD, insomnia and panic attacks.  Patient advised to contact office with any questions,  adverse effects, or acute worsening in signs and symptoms.  Discussed  potential metabolic side effects associated with atypical antipsychotics, as well as potential risk for movement side effects. Advised pt to contact office if movement side effects occur.   Discussed continuing current medication regimen.  Diagnoses and all orders for this visit:  MDD (major depressive disorder), recurrent episode, mild  GAD (generalized anxiety disorder)  Insomnia, unspecified type  Panic attack as reaction to stress     Please see After Visit Summary for patient specific instructions.  No future appointments.   No orders of the defined types were placed in this encounter.     -------------------------------     [1]  Allergies Allergen Reactions   Penicillins Rash

## 2024-08-26 ENCOUNTER — Encounter: Payer: Self-pay | Admitting: Sports Medicine

## 2024-09-03 ENCOUNTER — Encounter

## 2024-09-09 ENCOUNTER — Other Ambulatory Visit: Payer: Self-pay | Admitting: Family

## 2024-09-09 DIAGNOSIS — E039 Hypothyroidism, unspecified: Secondary | ICD-10-CM

## 2024-09-21 ENCOUNTER — Ambulatory Visit: Admitting: Sports Medicine

## 2024-10-30 ENCOUNTER — Telehealth: Admitting: Adult Health
